# Patient Record
Sex: Female | Born: 1935
Health system: Southern US, Community
[De-identification: ages and names within clinical notes are randomized; demographics above are authoritative.]

## PROBLEM LIST (undated history)

## (undated) DIAGNOSIS — I4891 Unspecified atrial fibrillation: Secondary | ICD-10-CM

## (undated) DIAGNOSIS — N3281 Overactive bladder: Secondary | ICD-10-CM

## (undated) DIAGNOSIS — E7800 Pure hypercholesterolemia, unspecified: Secondary | ICD-10-CM

## (undated) DIAGNOSIS — I10 Essential (primary) hypertension: Secondary | ICD-10-CM

## (undated) HISTORY — DX: Pure hypercholesterolemia, unspecified: E78.00

## (undated) HISTORY — DX: Unspecified atrial fibrillation: I48.91

## (undated) HISTORY — DX: Essential (primary) hypertension: I10

## (undated) HISTORY — DX: Overactive bladder: N32.81

---

## 2020-11-04 DIAGNOSIS — Z0000 Encounter for general adult medical examination without abnormal findings: Secondary | ICD-10-CM | POA: Diagnosis not present

## 2020-11-04 DIAGNOSIS — N3281 Overactive bladder: Secondary | ICD-10-CM | POA: Diagnosis not present

## 2020-12-07 ENCOUNTER — Encounter: Payer: Self-pay | Admitting: Cardiology

## 2020-12-07 ENCOUNTER — Other Ambulatory Visit: Payer: Self-pay

## 2020-12-07 ENCOUNTER — Ambulatory Visit (INDEPENDENT_AMBULATORY_CARE_PROVIDER_SITE_OTHER): Payer: Medicare Other | Admitting: Cardiology

## 2020-12-07 VITALS — BP 112/66 | HR 121 | Ht 63.0 in | Wt <= 1120 oz

## 2020-12-07 DIAGNOSIS — I10 Essential (primary) hypertension: Secondary | ICD-10-CM

## 2020-12-07 DIAGNOSIS — I4819 Other persistent atrial fibrillation: Secondary | ICD-10-CM | POA: Diagnosis not present

## 2020-12-07 DIAGNOSIS — Z01812 Encounter for preprocedural laboratory examination: Secondary | ICD-10-CM

## 2020-12-07 DIAGNOSIS — I480 Paroxysmal atrial fibrillation: Secondary | ICD-10-CM | POA: Diagnosis not present

## 2020-12-07 LAB — CBC
Hematocrit: 38.2 % (ref 34.0–46.6)
Hemoglobin: 11.9 g/dL (ref 11.1–15.9)
MCH: 26.4 pg — ABNORMAL LOW (ref 26.6–33.0)
MCHC: 31.2 g/dL — ABNORMAL LOW (ref 31.5–35.7)
MCV: 85 fL (ref 79–97)
Platelets: 193 10*3/uL (ref 150–450)
RBC: 4.51 x10E6/uL (ref 3.77–5.28)
RDW: 15.3 % (ref 11.7–15.4)
WBC: 9 10*3/uL (ref 3.4–10.8)

## 2020-12-07 LAB — BASIC METABOLIC PANEL
BUN/Creatinine Ratio: 34 — ABNORMAL HIGH (ref 12–28)
BUN: 32 mg/dL — ABNORMAL HIGH (ref 8–27)
CO2: 21 mmol/L (ref 20–29)
Calcium: 9 mg/dL (ref 8.7–10.3)
Chloride: 102 mmol/L (ref 96–106)
Creatinine, Ser: 0.93 mg/dL (ref 0.57–1.00)
Glucose: 115 mg/dL — ABNORMAL HIGH (ref 65–99)
Potassium: 4.4 mmol/L (ref 3.5–5.2)
Sodium: 140 mmol/L (ref 134–144)
eGFR: 59 mL/min/{1.73_m2} — ABNORMAL LOW (ref >59)

## 2020-12-07 MED ORDER — FLECAINIDE ACETATE 50 MG PO TABS: 75.0000 mg | ORAL_TABLET | Freq: Two times a day (BID) | ORAL | 1 refills | Status: DC

## 2020-12-07 NOTE — Progress Notes (Signed)
Electrophysiology Office Note:    Date:  12/07/2020   ID:  Kristin Hancock, DOB 08/01/1931, MRN [id number]  PCP:  Mitchell, L.Dean, MD  [facility] Cardiologist:  No primary care provider on file.  [facility] Electrophysiologist:  CAMERON T LAMBERT, MD   Referring MD: No ref. provider found   Chief Complaint: Atrial fibrillation  History of Present Illness:    Kristin Hancock is a 87 y.o. female who presents for an evaluation of atrial fibrillation at the request of Dr. Baugh. Their medical history includes atrial fibrillation, hypertension, hypercholesterolemia.  She last saw Dr. Baugh in [place] on Feb 20, 2020.  She is recently relocated to [place] to be closer to family.  She has been followed in [place] for her atrial fibrillation.  She is maintained on Eliquis 5 mg twice daily and flecainide 50 mg twice daily and atenolol 100 mg daily.  She has very minimal symptoms when she is out of rhythm.  Actually she does not know that she is out of rhythm during today's appointment until I showed her the EKG.  Past Medical History:  Diagnosis Date  . Atrial fibrillation (HCC)   . Hypercholesterolemia   . Hypertension   . Overactive bladder       Current Medications: Current Meds  Medication Sig  . amLODipine (NORVASC) 5 MG tablet Take 5 mg by mouth daily.  . apixaban (ELIQUIS) 2.5 MG TABS tablet Take 2.5 mg by mouth 2 (two) times daily.  . atenolol (TENORMIN) 100 MG tablet Take 100 mg by mouth daily.  . flecainide (TAMBOCOR) 50 MG tablet Take 1.5 tablets (75 mg total) by mouth 2 (two) times daily.  . hydrochlorothiazide (HYDRODIURIL) 12.5 MG tablet Take 12.5 mg by mouth daily.  . losartan (COZAAR) 100 MG tablet Take 100 mg by mouth daily.  . Multiple Vitamins-Minerals (PRESERVISION/LUTEIN) CAPS Take 1 capsule by mouth in the morning and at bedtime.  . simvastatin (ZOCOR) 20 MG tablet Take 20 mg by mouth daily.  . [DISCONTINUED] flecainide  (TAMBOCOR) 50 MG tablet Take 50 mg by mouth 2 (two) times daily.     Allergies:   Patient has no known allergies.   Social History   Socioeconomic History  . Marital status: Married    Spouse name: Not on file  . Number of children: Not on file  . Years of education: Not on file  . Highest education level: Not on file  Occupational History  . Not on file  Tobacco Use  . Smoking status: Never Smoker  . Smokeless tobacco: Never Used  Substance and Sexual Activity  . Alcohol use: Not on file  . Drug use: Not on file  . Sexual activity: Not on file  Other Topics Concern  . Not on file  Social History Narrative  . Not on file   Social Determinants of Health   Financial Resource Strain: Not on file  Food Insecurity: Not on file  Transportation Needs: Not on file  Physical Activity: Not on file  Stress: Not on file  Social Connections: Not on file     Family History: The patient's family history includes CAD in her brother; COPD in her father; Diabetes in her brother and maternal grandmother; Heart failure in her mother.  ROS:   Please see the history of present illness.    All other systems reviewed and are negative.  EKGs/Labs/Other Studies Reviewed:    The following studies were reviewed today:  Prior notes from     EKG:  The ekg ordered today demonstrates atrial fibrillation with a ventricular rate of 121 bpm.  Recent Labs: No results found for requested labs within last 8760 hours.  Recent Lipid Panel No results found for: CHOL, TRIG, HDL, CHOLHDL, VLDL, LDLCALC, LDLDIRECT  Physical Exam:    VS:  BP 112/66   Pulse (!) 121   Ht 5\' 3"  (1.6 m)   Wt 12 lb (5.443 kg)   SpO2 99%   BMI 2.13 kg/m     Wt Readings from Last 3 Encounters:  12/07/20 12 lb (5.443 kg)     GEN:  Well nourished, well developed in no acute distress HEENT: Normal NECK: No JVD; No carotid bruits LYMPHATICS: No lymphadenopathy CARDIAC: Irregularly irregular,  tachycardic, no murmurs, rubs, gallops RESPIRATORY:  Clear to auscultation without rales, wheezing or rhonchi  ABDOMEN: Soft, non-tender, non-distended MUSCULOSKELETAL:  No edema; No deformity  SKIN: Warm and dry NEUROLOGIC:  Alert and oriented x 3 PSYCHIATRIC:  Normal affect   ASSESSMENT:    1. Persistent atrial fibrillation (HCC)   2. Pre-procedure lab exam   3. Primary hypertension    PLAN:    In order of problems listed above:  1. Persistent atrial fibrillation Patient is in poorly rate controlled atrial fibrillation despite treatment with flecainide, atenolol.  We will plan to increase her flecainide to 75 mg by mouth twice daily.  She will continue taking the current dose of atenolol.  We will arrange for a synchronized cardioversion to restore normal rhythm.  2.  Hypertension Controlled.  Continue losartan, hydrochlorothiazide and amlodipine   Medication Adjustments/Labs and Tests Ordered: Current medicines are reviewed at length with the patient today.  Concerns regarding medicines are outlined above.  Orders Placed This Encounter  Procedures  . Basic metabolic panel  . CBC  . EKG 12-Lead   Meds ordered this encounter  Medications  . flecainide (TAMBOCOR) 50 MG tablet    Sig: Take 1.5 tablets (75 mg total) by mouth 2 (two) times daily.    Dispense:  270 tablet    Refill:  1     Signed, 12/09/20, MD, [facility]  12/07/2020 1:51 PM    Electrophysiology [facility]

## 2020-12-07 NOTE — Patient Instructions (Addendum)
Medication Instructions:  Your physician has recommended you make the following change in your medication:  1. INCREASE Flecainide to 75 mg twice daily  *If you need a refill on your cardiac medications before your next appointment, please call your pharmacy*   Lab Work: Pre procedure labs today: BMET & CBC If you have labs (blood work) drawn today and your tests are completely normal, you will receive your results only by: Marland Kitchen MyChart Message (if you have MyChart) OR . A paper copy in the mail If you have any lab test that is abnormal or we need to change your treatment, we will call you to review the results.   Testing/Procedures: Your physician has recommended that you have a Cardioversion (DCCV). Electrical Cardioversion uses a jolt of electricity to your heart either through paddles or wired patches attached to your chest. This is a controlled, usually prescheduled, procedure. Defibrillation is done under light anesthesia in the hospital, and you usually go home the day of the procedure. This is done to get your heart back into a normal rhythm. You are not awake for the procedure. Please see the instructions below located under "other instructions".     Follow-Up: At [facility], you and your health needs are our priority.  As part of our continuing mission to provide you with exceptional heart care, we have created designated Provider Care Teams.  These Care Teams include your primary Cardiologist (physician) and Advanced Practice Providers (APPs -  Physician Assistants and Nurse Practitioners) who all work together to provide you with the care you need, when you need it.  We recommend signing up for the patient portal called "MyChart".  Sign up information is provided on this After Visit Summary.  MyChart is used to connect with patients for Virtual Visits (Telemedicine).  Patients are able to view lab/test results, encounter notes, upcoming appointments, etc.  Non-urgent messages can  be sent to your provider as well.   To learn more about what you can do with MyChart, go to [email or web address].    Your next appointment:   3 month(s)  The format for your next appointment:   In Person  Provider:   Steffanie Dunn, MD    Thank you for choosing [facility]!!   [phone]   Other Instructions  COVID TEST-- On 12/15/20 @ 11:00 am - You will go to [address] [place], [place] for your Covid testing.   This is a drive thru test site, stay in your car and the nurse team will come to your car to test you.  After you are tested please go home and self quarantine until the day of your procedure.     You are scheduled for a Cardioversion on 12/17/2020 with Dr. Mayford Knife.  Please arrive at the [facility] (Main Entrance A) at [facility]: [address] [place], [place] [address] at 9:00 am.   DIET: Nothing to eat or drink after midnight except a sip of water with medications (see medication instructions below)  Medication Instructions: Hold all medications the morning of this procedure  Continue your anticoagulant: Eliquis You will need to continue your anticoagulant after your procedure until you are told by your provider that it is safe to stop   Labs: Today 3/15  You must have a responsible person to drive you home and stay in the waiting area during your procedure. Failure to do so could result in cancellation.  Bring your insurance cards.  *Special Note: Every effort is  made to have your procedure done on time. Occasionally there are emergencies that occur at the hospital that may cause delays. Please be patient if a delay does occur.      Electrical Cardioversion Electrical cardioversion is the delivery of a jolt of electricity to restore a normal rhythm to the heart. A rhythm that is too fast or is not regular keeps the heart from pumping well. In this procedure, sticky patches or metal paddles are placed on the chest to deliver  electricity to the heart from a device. This procedure may be done in an emergency if:  There is low or no blood pressure as a result of the heart rhythm.  Normal rhythm must be restored as fast as possible to protect the brain and heart from further damage.  It may save a life. This may also be a scheduled procedure for irregular or fast heart rhythms that are not immediately life-threatening. Tell a health care provider about:  Any allergies you have.  All medicines you are taking, including vitamins, herbs, eye drops, creams, and over-the-counter medicines.  Any problems you or family members have had with anesthetic medicines.  Any blood disorders you have.  Any surgeries you have had.  Any medical conditions you have.  Whether you are pregnant or may be pregnant. What are the risks? Generally, this is a safe procedure. However, problems may occur, including:  Allergic reactions to medicines.  A blood clot that breaks free and travels to other parts of your body.  The possible return of an abnormal heart rhythm within hours or days after the procedure.  Your heart stopping (cardiac arrest). This is rare. What happens before the procedure? Medicines  Your health care provider may have you start taking: ? Blood-thinning medicines (anticoagulants) so your blood does not clot as easily. ? Medicines to help stabilize your heart rate and rhythm.  Ask your health care provider about: ? Changing or stopping your regular medicines. This is especially important if you are taking diabetes medicines or blood thinners. ? Taking medicines such as aspirin and ibuprofen. These medicines can thin your blood. Do not take these medicines unless your health care provider tells you to take them. ? Taking over-the-counter medicines, vitamins, herbs, and supplements. General instructions  Follow instructions from your health care provider about eating or drinking restrictions.  Plan to  have someone take you home from the hospital or clinic.  If you will be going home right after the procedure, plan to have someone with you for 24 hours.  Ask your health care provider what steps will be taken to help prevent infection. These may include washing your skin with a germ-killing soap. What happens during the procedure?  An IV will be inserted into one of your veins.  Sticky patches (electrodes) or metal paddles may be placed on your chest.  You will be given a medicine to help you relax (sedative).  An electrical shock will be delivered. The procedure may vary among health care providers and hospitals.   What can I expect after the procedure?  Your blood pressure, heart rate, breathing rate, and blood oxygen level will be monitored until you leave the hospital or clinic.  Your heart rhythm will be watched to make sure it does not change.  You may have some redness on the skin where the shocks were given. Follow these instructions at home:  Do not drive for 24 hours if you were given a sedative during your procedure.  Take over-the-counter and prescription medicines only as told by your health care provider.  Ask your health care provider how to check your pulse. Check it often.  Rest for 48 hours after the procedure or as told by your health care provider.  Avoid or limit your caffeine use as told by your health care provider.  Keep all follow-up visits as told by your health care provider. This is important. Contact a health care provider if:  You feel like your heart is beating too quickly or your pulse is not regular.  You have a serious muscle cramp that does not go away. Get help right away if:  You have discomfort in your chest.  You are dizzy or you feel faint.  You have trouble breathing or you are short of breath.  Your speech is slurred.  You have trouble moving an arm or leg on one side of your body.  Your fingers or toes turn cold or  blue. Summary  Electrical cardioversion is the delivery of a jolt of electricity to restore a normal rhythm to the heart.  This procedure may be done right away in an emergency or may be a scheduled procedure if the condition is not an emergency.  Generally, this is a safe procedure.  After the procedure, check your pulse often as told by your health care provider. This information is not intended to replace advice given to you by your health care provider. Make sure you discuss any questions you have with your health care provider. Document Revised: 04/14/2019 Document Reviewed: 04/14/2019 Elsevier Patient Education  2021 Elsevier In

## 2020-12-07 NOTE — H&P (View-Only) (Signed)
Electrophysiology Office Note:    Date:  12/07/2020   ID:  Kristin Hancock, DOB July 11, 1931, MRN [id number]  PCP:  Asencion Gowda.August Saucer, MD  [facility] Cardiologist:  No primary care provider on file.  [facility] Electrophysiologist:  Lanier Prude, MD   Referring MD: No ref. provider found   Chief Complaint: Atrial fibrillation  History of Present Illness:    Kristin Hancock is a 87 y.o. female who presents for an evaluation of atrial fibrillation at the request of Dr. Merceda Elks. Their medical history includes atrial fibrillation, hypertension, hypercholesterolemia.  She last saw Dr. Merceda Elks in [place] on Feb 20, 2020.  She is recently relocated to [facility] to be closer to family.  She has been followed in [place] for her atrial fibrillation.  She is maintained on Eliquis 5 mg twice daily and flecainide 50 mg twice daily and atenolol 100 mg daily.  She has very minimal symptoms when she is out of rhythm.  Actually she does not know that she is out of rhythm during today's appointment until I showed her the EKG.  Past Medical History:  Diagnosis Date  . Atrial fibrillation (HCC)   . Hypercholesterolemia   . Hypertension   . Overactive bladder       Current Medications: Current Meds  Medication Sig  . amLODipine (NORVASC) 5 MG tablet Take 5 mg by mouth daily.  Marland Kitchen apixaban (ELIQUIS) 2.5 MG TABS tablet Take 2.5 mg by mouth 2 (two) times daily.  Marland Kitchen atenolol (TENORMIN) 100 MG tablet Take 100 mg by mouth daily.  . flecainide (TAMBOCOR) 50 MG tablet Take 1.5 tablets (75 mg total) by mouth 2 (two) times daily.  . hydrochlorothiazide (HYDRODIURIL) 12.5 MG tablet Take 12.5 mg by mouth daily.  Marland Kitchen losartan (COZAAR) 100 MG tablet Take 100 mg by mouth daily.  . Multiple Vitamins-Minerals (PRESERVISION/LUTEIN) CAPS Take 1 capsule by mouth in the Hancock and at bedtime.  . simvastatin (ZOCOR) 20 MG tablet Take 20 mg by mouth daily.  . [DISCONTINUED] flecainide  (TAMBOCOR) 50 MG tablet Take 50 mg by mouth 2 (two) times daily.     Allergies:   Patient has no known allergies.   Social History   Socioeconomic History  . Marital status: Married    Spouse name: Not on file  . Number of children: Not on file  . Years of education: Not on file  . Highest education level: Not on file  Occupational History  . Not on file  Tobacco Use  . Smoking status: Never Smoker  . Smokeless tobacco: Never Used  Substance and Sexual Activity  . Alcohol use: Not on file  . Drug use: Not on file  . Sexual activity: Not on file  Other Topics Concern  . Not on file  Social History Narrative  . Not on file   Social Determinants of Health   Financial Resource Strain: Not on file  Food Insecurity: Not on file  Transportation Needs: Not on file  Physical Activity: Not on file  Stress: Not on file  Social Connections: Not on file     Family History: The patient's family history includes CAD in her brother; COPD in her father; Diabetes in her brother and maternal grandmother; Heart failure in her mother.  ROS:   Please see the history of present illness.    All other systems reviewed and are negative.  EKGs/Labs/Other Studies Reviewed:    The following studies were reviewed today:  Prior notes from [place]  EKG:  The ekg ordered today demonstrates atrial fibrillation with a ventricular rate of 121 bpm.  Recent Labs: No results found for requested labs within last 8760 hours.  Recent Lipid Panel No results found for: CHOL, TRIG, HDL, CHOLHDL, VLDL, LDLCALC, LDLDIRECT  Physical Exam:    VS:  BP 112/66   Pulse (!) 121   Ht 5\' 3"  (1.6 m)   Wt 12 lb (5.443 kg)   SpO2 99%   BMI 2.13 kg/m     Wt Readings from Last 3 Encounters:  12/07/20 12 lb (5.443 kg)     GEN:  Well nourished, well developed in no acute distress HEENT: Normal NECK: No JVD; No carotid bruits LYMPHATICS: No lymphadenopathy CARDIAC: Irregularly irregular,  tachycardic, no murmurs, rubs, gallops RESPIRATORY:  Clear to auscultation without rales, wheezing or rhonchi  ABDOMEN: Soft, non-tender, non-distended MUSCULOSKELETAL:  No edema; No deformity  SKIN: Warm and dry NEUROLOGIC:  Alert and oriented x 3 PSYCHIATRIC:  Normal affect   ASSESSMENT:    1. Persistent atrial fibrillation (HCC)   2. Pre-procedure lab exam   3. Primary hypertension    PLAN:    In order of problems listed above:  1. Persistent atrial fibrillation Patient is in poorly rate controlled atrial fibrillation despite treatment with flecainide, atenolol.  We will plan to increase her flecainide to 75 mg by mouth twice daily.  She will continue taking the current dose of atenolol.  We will arrange for a synchronized cardioversion to restore normal rhythm.  2.  Hypertension Controlled.  Continue losartan, hydrochlorothiazide and amlodipine   Medication Adjustments/Labs and Tests Ordered: Current medicines are reviewed at length with the patient today.  Concerns regarding medicines are outlined above.  Orders Placed This Encounter  Procedures  . Basic metabolic panel  . CBC  . EKG 12-Lead   Meds ordered this encounter  Medications  . flecainide (TAMBOCOR) 50 MG tablet    Sig: Take 1.5 tablets (75 mg total) by mouth 2 (two) times daily.    Dispense:  270 tablet    Refill:  1     Signed, 12/09/20, MD, [facility]  12/07/2020 1:51 PM    Electrophysiology [facility]

## 2020-12-14 DIAGNOSIS — B372 Candidiasis of skin and nail: Secondary | ICD-10-CM | POA: Diagnosis not present

## 2020-12-15 ENCOUNTER — Other Ambulatory Visit (HOSPITAL_COMMUNITY)
Admission: RE | Admit: 2020-12-15 | Discharge: 2020-12-15 | Disposition: A | Discharge status: Home / Self Care | Payer: Medicare Other | Source: Ambulatory Visit | Attending: Cardiology | Admitting: Cardiology

## 2020-12-15 DIAGNOSIS — Z01812 Encounter for preprocedural laboratory examination: Secondary | ICD-10-CM | POA: Diagnosis not present

## 2020-12-15 DIAGNOSIS — Z20822 Contact with and (suspected) exposure to covid-19: Secondary | ICD-10-CM | POA: Insufficient documentation

## 2020-12-15 LAB — SARS CORONAVIRUS 2 (TAT 6-24 HRS): SARS Coronavirus 2: NEGATIVE

## 2020-12-17 ENCOUNTER — Other Ambulatory Visit: Payer: Self-pay

## 2020-12-17 ENCOUNTER — Ambulatory Visit (HOSPITAL_COMMUNITY)
Admission: RE | Admit: 2020-12-17 | Discharge: 2020-12-17 | Disposition: A | Discharge status: Home / Self Care | Payer: Medicare Other | Attending: Cardiology | Admitting: Cardiology

## 2020-12-17 ENCOUNTER — Encounter (HOSPITAL_COMMUNITY)
Admission: RE | Disposition: A | Discharge status: Home / Self Care | Payer: Self-pay | Source: Home / Self Care | Attending: Cardiology

## 2020-12-17 ENCOUNTER — Encounter (HOSPITAL_COMMUNITY): Payer: Self-pay | Admitting: Cardiology

## 2020-12-17 ENCOUNTER — Ambulatory Visit (HOSPITAL_COMMUNITY): Payer: Medicare Other | Admitting: Certified Registered Nurse Anesthetist

## 2020-12-17 DIAGNOSIS — Z7901 Long term (current) use of anticoagulants: Secondary | ICD-10-CM | POA: Diagnosis not present

## 2020-12-17 DIAGNOSIS — Z79899 Other long term (current) drug therapy: Secondary | ICD-10-CM | POA: Insufficient documentation

## 2020-12-17 DIAGNOSIS — E7800 Pure hypercholesterolemia, unspecified: Secondary | ICD-10-CM | POA: Insufficient documentation

## 2020-12-17 DIAGNOSIS — I10 Essential (primary) hypertension: Secondary | ICD-10-CM | POA: Diagnosis not present

## 2020-12-17 DIAGNOSIS — Z8249 Family history of ischemic heart disease and other diseases of the circulatory system: Secondary | ICD-10-CM | POA: Insufficient documentation

## 2020-12-17 DIAGNOSIS — I4819 Other persistent atrial fibrillation: Secondary | ICD-10-CM

## 2020-12-17 HISTORY — PX: CARDIOVERSION: SHX1299

## 2020-12-17 SURGERY — CARDIOVERSION
Anesthesia: General

## 2020-12-17 MED ORDER — SODIUM CHLORIDE 0.9 % IV SOLN: INTRAVENOUS | Status: DC | PRN

## 2020-12-17 MED ORDER — LIDOCAINE 2% (20 MG/ML) 5 ML SYRINGE
INTRAMUSCULAR | Status: DC | PRN
  Administered 2020-12-17: 60 mg via INTRAVENOUS

## 2020-12-17 MED ORDER — PROPOFOL 10 MG/ML IV BOLUS
INTRAVENOUS | Status: DC | PRN
  Administered 2020-12-17: 50 mg via INTRAVENOUS

## 2020-12-17 MED ORDER — APIXABAN 2.5 MG PO TABS
2.5000 mg | ORAL_TABLET | Freq: Once | ORAL | Status: AC
  Administered 2020-12-17: 2.5 mg via ORAL
  Filled 2020-12-17: qty 1

## 2020-12-17 MED ORDER — EPHEDRINE SULFATE-NACL 50-0.9 MG/10ML-% IV SOSY
PREFILLED_SYRINGE | INTRAVENOUS | Status: DC | PRN
  Administered 2020-12-17: 10 mg via INTRAVENOUS

## 2020-12-17 MED ORDER — APIXABAN 5 MG PO TABS: 5.0000 mg | ORAL_TABLET | Freq: Two times a day (BID) | ORAL | 3 refills | Status: DC

## 2020-12-17 NOTE — CV Procedure (Addendum)
   Electrical Cardioversion Procedure Note Kristin Hancock [id number] 1930-12-08  Procedure: Electrical Cardioversion Indications:  Atrial Fibrillation  Time Out: Verified patient identification, verified procedure,medications/allergies/relevent history reviewed, required imaging and test results available.  Performed  Procedure Details  The patient was NPO after midnight. Anesthesia was administered at the beside  by Dr.Hodierne with 50mg  of propofol and 60mg  Lidocaine.  Cardioversion was done with synchronized biphasic defibrillation with AP pads with 150watts.  The patient converted to normal sinus rhythm. The patient tolerated the procedure well   IMPRESSION:  Successful cardioversion of atrial fibrillation  Discussed with Dr. >>patient had been on Eliquis 5mg  BID but decreased to 2.5mg  BID due to GI bleed last April.  Due to DCCV will increase Eliquis to 5mg  BID give that her weight is > 60kg.  Followup with Dr. Lalla Brothers in 4 weeks at which time he will decrease dose back to 2.5mg  BID.    Kristin Hancock 12/17/2020, 8:59 AM

## 2020-12-17 NOTE — Anesthesia Postprocedure Evaluation (Signed)
Anesthesia Post Note  Patient: Janie Morning  Procedure(s) Performed: CARDIOVERSION (N/A )     Patient location during evaluation: Endoscopy Anesthesia Type: General Level of consciousness: awake and alert Pain management: pain level controlled Vital Signs Assessment: post-procedure vital signs reviewed and stable Respiratory status: spontaneous breathing, nonlabored ventilation, respiratory function stable and patient connected to nasal cannula oxygen Cardiovascular status: blood pressure returned to baseline and stable Postop Assessment: no apparent nausea or vomiting Anesthetic complications: no   No complications documented.  Last Vitals:  Vitals:   12/17/20 1007 12/17/20 1017  BP: (!) 107/51 (!) 109/52  Pulse: 64 64  Resp: 15 11  Temp:    SpO2: 97% 98%    Last Pain:  Vitals:   12/17/20 0947  TempSrc:   PainSc: 0-No pain                 Prisca Gearing S

## 2020-12-17 NOTE — Anesthesia Preprocedure Evaluation (Signed)
Anesthesia Evaluation  Patient identified by MRN, date of birth, ID band Patient awake    Reviewed: Allergy & Precautions, H&P , NPO status , Patient's Chart, lab work & pertinent test results  Airway Mallampati: II   Neck ROM: full    Dental   Pulmonary neg pulmonary ROS,    breath sounds clear to auscultation       Cardiovascular hypertension, + dysrhythmias Atrial Fibrillation  Rhythm:irregular Rate:Normal     Neuro/Psych    GI/Hepatic   Endo/Other    Renal/GU      Musculoskeletal   Abdominal   Peds  Hematology   Anesthesia Other Findings   Reproductive/Obstetrics                             Anesthesia Physical Anesthesia Plan  ASA: III  Anesthesia Plan: General   Post-op Pain Management:    Induction: Intravenous  PONV Risk Score and Plan: 3 and Propofol infusion and Treatment may vary due to age or medical condition  Airway Management Planned: Mask  Additional Equipment:   Intra-op Plan:   Post-operative Plan:   Informed Consent: I have reviewed the patients History and Physical, chart, labs and discussed the procedure including the risks, benefits and alternatives for the proposed anesthesia with the patient or authorized representative who has indicated his/her understanding and acceptance.     Dental advisory given  Plan Discussed with: CRNA, Anesthesiologist and Surgeon  Anesthesia Plan Comments:         Anesthesia Quick Evaluation

## 2020-12-17 NOTE — Transfer of Care (Signed)
Immediate Anesthesia Transfer of Care Note  Patient: Kristin Hancock  Procedure(s) Performed: CARDIOVERSION (N/A )  Patient Location: PACU and Endoscopy Unit  Anesthesia Type:General  Level of Consciousness: drowsy and responds to stimulation  Airway & Oxygen Therapy: Patient Spontanous Breathing  Post-op Assessment: Report given to RN and Post -op Vital signs reviewed and stable  Post vital signs: Reviewed and stable  Last Vitals:  Vitals Value Taken Time  BP    Temp    Pulse    Resp    SpO2      Last Pain:  Vitals:   12/17/20 0915  TempSrc: Oral  PainSc: 0-No pain         Complications: No complications documented.

## 2020-12-17 NOTE — Interval H&P Note (Signed)
History and Physical Interval Note:  12/17/2020 8:59 AM  Kristin Hancock  has presented today for surgery, with the diagnosis of AFIB.  The various methods of treatment have been discussed with the patient and family. After consideration of risks, benefits and other options for treatment, the patient has consented to  Procedure(s): CARDIOVERSION (N/A) as a surgical intervention.  The patient's history has been reviewed, patient examined, no change in status, stable for surgery.  I have reviewed the patient's chart and labs.  Questions were answered to the patient's satisfaction.     Armanda Magic

## 2020-12-17 NOTE — Discharge Instructions (Signed)
Electrical Cardioversion Electrical cardioversion is the delivery of a jolt of electricity to restore a normal rhythm to the heart. A rhythm that is too fast or is not regular keeps the heart from pumping well. In this procedure, sticky patches or metal paddles are placed on the chest to deliver electricity to the heart from a device. This procedure may be done in an emergency if:  There is low or no blood pressure as a result of the heart rhythm.  Normal rhythm must be restored as fast as possible to protect the brain and heart from further damage.  It may save a life. This may also be a scheduled procedure for irregular or fast heart rhythms that are not immediately life-threatening. Tell a health care provider about:  Any allergies you have.  All medicines you are taking, including vitamins, herbs, eye drops, creams, and over-the-counter medicines.  Any problems you or family members have had with anesthetic medicines.  Any blood disorders you have.  Any surgeries you have had.  Any medical conditions you have.  Whether you are pregnant or may be pregnant. What are the risks? Generally, this is a safe procedure. However, problems may occur, including:  Allergic reactions to medicines.  A blood clot that breaks free and travels to other parts of your body.  The possible return of an abnormal heart rhythm within hours or days after the procedure.  Your heart stopping (cardiac arrest). This is rare. What happens before the procedure? Medicines  Your health care provider may have you start taking: ? Blood-thinning medicines (anticoagulants) so your blood does not clot as easily. ? Medicines to help stabilize your heart rate and rhythm.  Ask your health care provider about: ? Changing or stopping your regular medicines. This is especially important if you are taking diabetes medicines or blood thinners. ? Taking medicines such as aspirin and ibuprofen. These medicines can  thin your blood. Do not take these medicines unless your health care provider tells you to take them. ? Taking over-the-counter medicines, vitamins, herbs, and supplements. General instructions  Follow instructions from your health care provider about eating or drinking restrictions.  Plan to have someone take you home from the hospital or clinic.  If you will be going home right after the procedure, plan to have someone with you for 24 hours.  Ask your health care provider what steps will be taken to help prevent infection. These may include washing your skin with a germ-killing soap. What happens during the procedure?  An IV will be inserted into one of your veins.  Sticky patches (electrodes) or metal paddles may be placed on your chest.  You will be given a medicine to help you relax (sedative).  An electrical shock will be delivered. The procedure may vary among health care providers and hospitals.   What can I expect after the procedure?  Your blood pressure, heart rate, breathing rate, and blood oxygen level will be monitored until you leave the hospital or clinic.  Your heart rhythm will be watched to make sure it does not change.  You may have some redness on the skin where the shocks were given. Follow these instructions at home:  Do not drive for 24 hours if you were given a sedative during your procedure.  Take over-the-counter and prescription medicines only as told by your health care provider.  Ask your health care provider how to check your pulse. Check it often.  Rest for 48 hours after the procedure   or as told by your health care provider.  Avoid or limit your caffeine use as told by your health care provider.  Keep all follow-up visits as told by your health care provider. This is important. Contact a health care provider if:  You feel like your heart is beating too quickly or your pulse is not regular.  You have a serious muscle cramp that does not go  away. Get help right away if:  You have discomfort in your chest.  You are dizzy or you feel faint.  You have trouble breathing or you are short of breath.  Your speech is slurred.  You have trouble moving an arm or leg on one side of your body.  Your fingers or toes turn cold or blue. Summary  Electrical cardioversion is the delivery of a jolt of electricity to restore a normal rhythm to the heart.  This procedure may be done right away in an emergency or may be a scheduled procedure if the condition is not an emergency.  Generally, this is a safe procedure.  After the procedure, check your pulse often as told by your health care provider. This information is not intended to replace advice given to you by your health care provider. Make sure you discuss any questions you have with your health care provider. Document Revised: 04/14/2019 Document Reviewed: 04/14/2019 Elsevier Patient Education  2021 Elsevier Inc.  

## 2020-12-17 NOTE — Anesthesia Procedure Notes (Signed)
Procedure Name: General with mask airway Date/Time: 12/17/2020 9:31 AM Performed by: Drema Pry, CRNA Pre-anesthesia Checklist: Patient identified, Emergency Drugs available, Suction available, Patient being monitored and Timeout performed Patient Re-evaluated:Patient Re-evaluated prior to induction Oxygen Delivery Method: Ambu bag Preoxygenation: Pre-oxygenation with 100% oxygen Induction Type: IV induction Ventilation: Mask ventilation without difficulty

## 2020-12-18 ENCOUNTER — Encounter (HOSPITAL_COMMUNITY): Payer: Self-pay | Admitting: Cardiology

## 2020-12-21 DIAGNOSIS — N3281 Overactive bladder: Secondary | ICD-10-CM | POA: Diagnosis not present

## 2020-12-28 DIAGNOSIS — H353213 Exudative age-related macular degeneration, right eye, with inactive scar: Secondary | ICD-10-CM | POA: Diagnosis not present

## 2020-12-28 DIAGNOSIS — H353121 Nonexudative age-related macular degeneration, left eye, early dry stage: Secondary | ICD-10-CM | POA: Diagnosis not present

## 2020-12-30 ENCOUNTER — Encounter: Payer: Self-pay | Admitting: Cardiology

## 2020-12-30 ENCOUNTER — Ambulatory Visit: Payer: Medicare Other | Admitting: Cardiology

## 2020-12-30 ENCOUNTER — Other Ambulatory Visit: Payer: Self-pay

## 2020-12-30 VITALS — BP 132/74 | HR 64 | Ht 63.0 in | Wt 170.6 lb

## 2020-12-30 DIAGNOSIS — I10 Essential (primary) hypertension: Secondary | ICD-10-CM

## 2020-12-30 DIAGNOSIS — I4819 Other persistent atrial fibrillation: Secondary | ICD-10-CM | POA: Diagnosis not present

## 2020-12-30 DIAGNOSIS — Z79899 Other long term (current) drug therapy: Secondary | ICD-10-CM

## 2020-12-30 MED ORDER — APIXABAN 5 MG PO TABS: 5.0000 mg | ORAL_TABLET | Freq: Two times a day (BID) | ORAL | 3 refills | Status: DC

## 2020-12-30 NOTE — Progress Notes (Signed)
Electrophysiology Office Follow up Visit Note:    Date:  12/30/2020   ID:  Kristin Hancock, DOB 12/02/1930, MRN [id number]  PCP:  Clovis Riley, L.August Saucer, MD  [facility] Cardiologist:  No primary care provider on file.  [facility] Electrophysiologist:  Lanier Prude, MD    Interval History:    Kristin Hancock is a 87 y.o. female who presents for a follow up visit for her persistent atrial fibrillation after cardioversion on December 17, 2020.  I last saw the patient December 07, 2020.  Prior to the cardioversion, the patient was on Eliquis 2.5 mg twice daily because of a GI bleed last April.  After the cardioversion we elected to increase the Eliquis back to 5 mg twice daily.  She presents today to discuss her anticoagulation dosing and whether not to stay at the current dose versus decrease.  She tells me she is feeling better after her cardioversion with increased energy.  She does not fatigue like she did when she was in atrial fibrillation.  No trouble while taking the Eliquis 5 mg by mouth twice daily.   Past Medical History:  Diagnosis Date  . Atrial fibrillation (HCC)   . Hypercholesterolemia   . Hypertension   . Overactive bladder     Past Surgical History:  Procedure Laterality Date  . CARDIOVERSION N/A 12/17/2020   Procedure: CARDIOVERSION;  Surgeon: Quintella Reichert, MD;  Location: [facility] ENDOSCOPY;  Service: Cardiovascular;  Laterality: N/A;    Current Medications: Current Meds  Medication Sig  . amLODipine (NORVASC) 5 MG tablet Take 5 mg by mouth daily.  Marland Kitchen atenolol (TENORMIN) 100 MG tablet Take 100 mg by mouth daily.  . flecainide (TAMBOCOR) 50 MG tablet Take 1.5 tablets (75 mg total) by mouth 2 (two) times daily.  . hydrochlorothiazide (HYDRODIURIL) 12.5 MG tablet Take 12.5 mg by mouth daily.  Marland Kitchen losartan (COZAAR) 100 MG tablet Take 100 mg by mouth daily.  . Multiple Vitamins-Minerals (PRESERVISION/LUTEIN) CAPS Take 1 capsule by mouth in the Hancock and at bedtime.   . simvastatin (ZOCOR) 20 MG tablet Take 20 mg by mouth daily.  . solifenacin (VESICARE) 5 MG tablet Take 5 mg by mouth daily.  . [DISCONTINUED] apixaban (ELIQUIS) 5 MG TABS tablet Take 1 tablet (5 mg total) by mouth 2 (two) times daily.     Allergies:   Patient has no known allergies.   Social History   Socioeconomic History  . Marital status: Married    Spouse name: Not on file  . Number of children: Not on file  . Years of education: Not on file  . Highest education level: Not on file  Occupational History  . Not on file  Tobacco Use  . Smoking status: Never Smoker  . Smokeless tobacco: Never Used  Substance and Sexual Activity  . Alcohol use: Not on file  . Drug use: Not on file  . Sexual activity: Not on file  Other Topics Concern  . Not on file  Social History Narrative  . Not on file   Social Determinants of Health   Financial Resource Strain: Not on file  Food Insecurity: Not on file  Transportation Needs: Not on file  Physical Activity: Not on file  Stress: Not on file  Social Connections: Not on file     Family History: The patient's family history includes CAD in her brother; COPD in her father; Diabetes in her brother and maternal grandmother; Heart failure in her mother.  ROS:   Please see  the history of present illness.    All other systems reviewed and are negative.  EKGs/Labs/Other Studies Reviewed:    The following studies were reviewed today:  Cardioversion records  EKG:  The ekg ordered today demonstrates sinus rhythm.  QRS duration 100 ms.  PR interval 182 ms.  Recent Labs: 12/07/2020: BUN 32; Creatinine, Ser 0.93; Hemoglobin 11.9; Platelets 193; Potassium 4.4; Sodium 140  Recent Lipid Panel No results found for: CHOL, TRIG, HDL, CHOLHDL, VLDL, LDLCALC, LDLDIRECT  Physical Exam:    VS:  BP 132/74   Pulse 64   Ht 5\' 3"  (1.6 m)   Wt 170 lb 9.6 oz (77.4 kg)   SpO2 96%   BMI 30.22 kg/m     Wt Readings from Last 3 Encounters:   12/30/20 170 lb 9.6 oz (77.4 kg)  12/17/20 169 lb 12.1 oz (77 kg)  12/07/20 12 lb (5.443 kg)     GEN:  Well nourished, well developed in no acute distress HEENT: Normal NECK: No JVD; No carotid bruits LYMPHATICS: No lymphadenopathy CARDIAC: RRR, no murmurs, rubs, gallops RESPIRATORY:  Clear to auscultation without rales, wheezing or rhonchi  ABDOMEN: Soft, non-tender, non-distended MUSCULOSKELETAL:  No edema; No deformity  SKIN: Warm and dry NEUROLOGIC:  Alert and oriented x 3 PSYCHIATRIC:  Normal affect   ASSESSMENT:    1. Persistent atrial fibrillation (HCC)   2. Primary hypertension   3. Encounter for long-term (current) use of high-risk medication    PLAN:    In order of problems listed above:  1. Persistent atrial fibrillation Doing well after cardioversion.  Maintaining sinus rhythm.  Feels better now that she is back in normal rhythm.  Given that she has tolerated the increased dose of Eliquis and has not had a bleed since the April hemorrhoidal bleed that prompted the reduced dose of Eliquis, I will plan to keep her on Eliquis 5 mg by mouth twice daily for stroke prophylaxis.  EKG is stable while taking flecainide.  I would continue this at the current dose.  Follow-up 6 months or sooner as needed.  2.  Hypertension Controlled on her current regimen hydrochlorothiazide, losartan, atenolol and amlodipine.  Follow-up 6 months    Medication Adjustments/Labs and Tests Ordered: Current medicines are reviewed at length with the patient today.  Concerns regarding medicines are outlined above.  Orders Placed This Encounter  Procedures  . EKG 12-Lead   Meds ordered this encounter  Medications  . apixaban (ELIQUIS) 5 MG TABS tablet    Sig: Take 1 tablet (5 mg total) by mouth 2 (two) times daily.    Dispense:  180 tablet    Refill:  3     Signed, May, MD, [facility], [facility] 12/30/2020 12:21 PM    Electrophysiology [facility]

## 2020-12-30 NOTE — Patient Instructions (Signed)
Medication Instructions:  Your physician recommends that you continue on your current medications as directed. Please refer to the Current Medication list given to you today. *If you need a refill on your cardiac medications before your next appointment, please call your pharmacy*  Lab Work: None ordered.  If you have labs (blood work) drawn today and your tests are completely normal, you will receive your results only by: . MyChart Message (if you have MyChart) OR . A paper copy in the mail If you have any lab test that is abnormal or we need to change your treatment, we will call you to review the results.  Testing/Procedures: None ordered.  Follow-Up: At [facility], you and your health needs are our priority.  As part of our continuing mission to provide you with exceptional heart care, we have created designated Provider Care Teams.  These Care Teams include your primary Cardiologist (physician) and Advanced Practice Providers (APPs -  Physician Assistants and Nurse Practitioners) who all work together to provide you with the care you need, when you need it.  Your next appointment:   Your physician wants you to follow-up in: 6 months with [name].  You will receive a reminder letter in the mail two months in advance. If you don't receive a letter, please call our office to schedule the follow-up appointment.   

## 2021-02-08 DIAGNOSIS — H35371 Puckering of macula, right eye: Secondary | ICD-10-CM | POA: Diagnosis not present

## 2021-02-08 DIAGNOSIS — H43813 Vitreous degeneration, bilateral: Secondary | ICD-10-CM | POA: Diagnosis not present

## 2021-02-08 DIAGNOSIS — H26492 Other secondary cataract, left eye: Secondary | ICD-10-CM | POA: Diagnosis not present

## 2021-02-08 DIAGNOSIS — H353133 Nonexudative age-related macular degeneration, bilateral, advanced atrophic without subfoveal involvement: Secondary | ICD-10-CM | POA: Diagnosis not present

## 2021-02-09 DIAGNOSIS — R8290 Unspecified abnormal findings in urine: Secondary | ICD-10-CM | POA: Diagnosis not present

## 2021-03-03 ENCOUNTER — Ambulatory Visit: Payer: Medicare Other | Admitting: Cardiology

## 2021-03-25 ENCOUNTER — Other Ambulatory Visit: Payer: Self-pay | Admitting: Family Medicine

## 2021-03-25 ENCOUNTER — Ambulatory Visit
Admission: RE | Admit: 2021-03-25 | Discharge: 2021-03-25 | Disposition: A | Discharge status: Home / Self Care | Payer: Medicare Other | Source: Ambulatory Visit | Attending: Family Medicine | Admitting: Family Medicine

## 2021-03-25 ENCOUNTER — Other Ambulatory Visit: Payer: Self-pay

## 2021-03-25 DIAGNOSIS — M25561 Pain in right knee: Secondary | ICD-10-CM | POA: Diagnosis not present

## 2021-03-25 DIAGNOSIS — S8001XA Contusion of right knee, initial encounter: Secondary | ICD-10-CM | POA: Diagnosis not present

## 2021-03-25 DIAGNOSIS — R58 Hemorrhage, not elsewhere classified: Secondary | ICD-10-CM | POA: Diagnosis not present

## 2021-04-26 ENCOUNTER — Other Ambulatory Visit: Payer: Self-pay | Admitting: Cardiology

## 2021-05-04 ENCOUNTER — Encounter: Payer: Self-pay | Admitting: Cardiology

## 2021-05-04 DIAGNOSIS — N3281 Overactive bladder: Secondary | ICD-10-CM | POA: Diagnosis not present

## 2021-05-04 DIAGNOSIS — I4891 Unspecified atrial fibrillation: Secondary | ICD-10-CM | POA: Diagnosis not present

## 2021-05-04 DIAGNOSIS — E7800 Pure hypercholesterolemia, unspecified: Secondary | ICD-10-CM | POA: Diagnosis not present

## 2021-05-04 DIAGNOSIS — I10 Essential (primary) hypertension: Secondary | ICD-10-CM | POA: Diagnosis not present

## 2021-05-06 ENCOUNTER — Telehealth: Payer: Self-pay | Admitting: Cardiology

## 2021-05-06 NOTE — Telephone Encounter (Signed)
Made in Error

## 2021-06-16 ENCOUNTER — Encounter: Payer: Self-pay | Admitting: Cardiology

## 2021-06-16 ENCOUNTER — Ambulatory Visit: Payer: Medicare Other | Admitting: Cardiology

## 2021-06-16 ENCOUNTER — Other Ambulatory Visit: Payer: Self-pay

## 2021-06-16 VITALS — BP 122/64 | HR 65 | Ht 63.0 in | Wt 160.6 lb

## 2021-06-16 DIAGNOSIS — Z79899 Other long term (current) drug therapy: Secondary | ICD-10-CM

## 2021-06-16 DIAGNOSIS — I4819 Other persistent atrial fibrillation: Secondary | ICD-10-CM | POA: Diagnosis not present

## 2021-06-16 DIAGNOSIS — I10 Essential (primary) hypertension: Secondary | ICD-10-CM

## 2021-06-16 NOTE — Patient Instructions (Addendum)
Medication Instructions:  Your physician recommends that you continue on your current medications as directed. Please refer to the Current Medication list given to you today. *If you need a refill on your cardiac medications before your next appointment, please call your pharmacy*  Lab Work: None ordered. If you have labs (blood work) drawn today and your tests are completely normal, you will receive your results only by: MyChart Message (if you have MyChart) OR A paper copy in the mail If you have any lab test that is abnormal or we need to change your treatment, we will call you to review the results.  Testing/Procedures: None ordered.  Follow-Up: At [facility], you and your health needs are our priority.  As part of our continuing mission to provide you with exceptional heart care, we have created designated Provider Care Teams.  These Care Teams include your primary Cardiologist (physician) and Advanced Practice Providers (APPs -  Physician Assistants and Nurse Practitioners) who all work together to provide you with the care you need, when you need it.  Your next appointment:   Your physician wants you to follow-up in: 6 months with with one of the following Advanced Practice Providers on your designated Care Team:   Francis Dowse, PA-C Casimiro Needle "Mardelle Matte" [place], [place] You will receive a reminder letter in the mail two months in advance. If you don't receive a letter, please call our office to schedule the follow-up appointment.

## 2021-06-16 NOTE — Progress Notes (Signed)
Electrophysiology Office Follow up Visit Note:    Date:  06/16/2021   ID:  Kristin Hancock, DOB 10/05/1930, MRN [id number]  PCP:  Clovis Riley, Elbert Ewings.Kristin Saucer, MD  [facility] Cardiologist:  None  [facility] Electrophysiologist:  Lanier Prude, MD    Interval History:    Kristin Hancock is a 87 y.o. female who presents for a follow up visit. They were last seen in clinic December 30, 2020 for her persistent atrial fibrillation.  For her atrial fibrillation she is maintained on flecainide 75 mg by mouth twice daily and atenolol 100 mg by mouth daily.  She is on Eliquis 5 mg by mouth twice daily for stroke prophylaxis.  She has done well since I last saw her without recurrent episode of atrial fibrillation.     Past Medical History:  Diagnosis Date   Atrial fibrillation (HCC)    Hypercholesterolemia    Hypertension    Overactive bladder     Past Surgical History:  Procedure Laterality Date   CARDIOVERSION N/A 12/17/2020   Procedure: CARDIOVERSION;  Surgeon: Quintella Reichert, MD;  Location: [facility] ENDOSCOPY;  Service: Cardiovascular;  Laterality: N/A;    Current Medications: Current Meds  Medication Sig   amLODipine (NORVASC) 5 MG tablet Take 5 mg by mouth daily.   apixaban (ELIQUIS) 5 MG TABS tablet Take 1 tablet (5 mg total) by mouth 2 (two) times daily.   atenolol (TENORMIN) 100 MG tablet Take 100 mg by mouth daily.   flecainide (TAMBOCOR) 50 MG tablet TAKE 1 AND 1/2 TABLETS BY  MOUTH TWICE DAILY   hydrochlorothiazide (HYDRODIURIL) 12.5 MG tablet Take 12.5 mg by mouth daily.   losartan (COZAAR) 100 MG tablet Take 100 mg by mouth daily.   Multiple Vitamins-Minerals (PRESERVISION/LUTEIN) CAPS Take 1 capsule by mouth in the [place]ancock and at bedtime.   simvastatin (ZOCOR) 20 MG tablet Take 20 mg by mouth daily.     Allergies:   Patient has no known allergies.   Social History   Socioeconomic History   Marital status: Married    Spouse name: Not on file   Number of children:  Not on file   Years of education: Not on file   Highest education level: Not on file  Occupational History   Not on file  Tobacco Use   Smoking status: Never   Smokeless tobacco: Never  Substance and Sexual Activity   Alcohol use: Not on file   Drug use: Not on file   Sexual activity: Not on file  Other Topics Concern   Not on file  Social History Narrative   Not on file   Social Determinants of Health   Financial Resource Strain: Not on file  Food Insecurity: Not on file  Transportation Needs: Not on file  Physical Activity: Not on file  Stress: Not on file  Social Connections: Not on file     Family History: The patient's family history includes CAD in her brother; COPD in her father; Diabetes in her brother and maternal grandmother; Heart failure in her mother.  ROS:   Please see the history of present illness.    All other systems reviewed and are negative.  EKGs/Labs/Other Studies Reviewed:    The following studies were reviewed today:   EKG:  The ekg ordered today demonstrates sinus rhythm.  PR interval 200 ms.  QRS duration 100 ms.  QTc 474 ms.  Recent Labs: 12/07/2020: BUN 32; Creatinine, Ser 0.93; Hemoglobin 11.9; Platelets 193; Potassium 4.4; Sodium 140  Recent  Lipid Panel No results found for: CHOL, TRIG, HDL, CHOLHDL, VLDL, LDLCALC, LDLDIRECT  Physical Exam:    VS:  BP 122/64   Pulse 65   Ht 5\' 3"  (1.6 m)   Wt 160 lb 9.6 oz (72.8 kg)   SpO2 95%   BMI 28.45 kg/m     Wt Readings from Last 3 Encounters:  06/16/21 160 lb 9.6 oz (72.8 kg)  12/30/20 170 lb 9.6 oz (77.4 kg)  12/17/20 169 lb 12.1 oz (77 kg)     GEN:  Well nourished, well developed in no acute distress HEENT: Normal NECK: No JVD; No carotid bruits LYMPHATICS: No lymphadenopathy CARDIAC: RRR, no murmurs, rubs, gallops RESPIRATORY:  Clear to auscultation without rales, wheezing or rhonchi  ABDOMEN: Soft, non-tender, non-distended MUSCULOSKELETAL:  No edema; No deformity  SKIN:  Warm and dry NEUROLOGIC:  Alert and oriented x 3 PSYCHIATRIC:  Normal affect   ASSESSMENT:    1. Persistent atrial fibrillation (HCC)   2. Primary hypertension   3. Encounter for long-term (current) use of high-risk medication    PLAN:    In order of problems listed above:  1. Persistent atrial fibrillation (HCC) Maintaining sinus rhythm on flecainide and atenolol.  On Eliquis for stroke prophylaxis.  For now, continue current therapy.  I will have her see one of the PAs in 6 months for repeat ECG.  2. Primary hypertension Controlled.  Continue current regimen.  3. Encounter for long-term (current) use of high-risk medication Intervals normal on today's EKG.  Continue current regimen.    Follow-up 6 months with one of the PAs.     Medication Adjustments/Labs and Tests Ordered: Current medicines are reviewed at length with the patient today.  Concerns regarding medicines are outlined above.  Orders Placed This Encounter  Procedures   EKG 12-Lead   No orders of the defined types were placed in this encounter.    Signed, 12/19/20, MD, [facility], [facility] 06/16/2021 9:38 PM    Electrophysiology [facility]

## 2021-07-26 DIAGNOSIS — H26492 Other secondary cataract, left eye: Secondary | ICD-10-CM | POA: Diagnosis not present

## 2021-07-26 DIAGNOSIS — H35371 Puckering of macula, right eye: Secondary | ICD-10-CM | POA: Diagnosis not present

## 2021-07-26 DIAGNOSIS — H43813 Vitreous degeneration, bilateral: Secondary | ICD-10-CM | POA: Diagnosis not present

## 2021-07-26 DIAGNOSIS — H353133 Nonexudative age-related macular degeneration, bilateral, advanced atrophic without subfoveal involvement: Secondary | ICD-10-CM | POA: Diagnosis not present

## 2021-08-24 ENCOUNTER — Other Ambulatory Visit: Payer: Self-pay

## 2021-08-24 MED ORDER — LOSARTAN POTASSIUM 100 MG PO TABS: 100.0000 mg | ORAL_TABLET | Freq: Every day | ORAL | 2 refills | Status: DC

## 2021-08-24 MED ORDER — ATENOLOL 100 MG PO TABS: 100.0000 mg | ORAL_TABLET | Freq: Every day | ORAL | 2 refills | Status: DC

## 2021-08-24 MED ORDER — SIMVASTATIN 20 MG PO TABS: 20.0000 mg | ORAL_TABLET | Freq: Every day | ORAL | 2 refills | Status: DC

## 2021-08-24 MED ORDER — HYDROCHLOROTHIAZIDE 12.5 MG PO TABS: 12.5000 mg | ORAL_TABLET | Freq: Every day | ORAL | 2 refills | Status: DC

## 2021-08-24 MED ORDER — AMLODIPINE BESYLATE 5 MG PO TABS: 5.0000 mg | ORAL_TABLET | Freq: Every day | ORAL | 2 refills | Status: DC

## 2021-08-24 NOTE — Telephone Encounter (Signed)
Pt's medications was sent to pt's pharmacy as requested. Confirmation received.  

## 2021-10-10 ENCOUNTER — Telehealth: Payer: Self-pay | Admitting: Cardiology

## 2021-10-10 NOTE — Telephone Encounter (Signed)
Pt c/o Shortness Of Breath: STAT if SOB developed within the last 24 hours or pt is noticeably SOB on the phone  1. Are you currently SOB (can you hear that pt is SOB on the phone)? No, is sitting down   2. How long have you been experiencing SOB? Couple of weeks   3. Are you SOB when sitting or when up moving around? Anytime she is doing something   4. Are you currently experiencing any other symptoms? No

## 2021-10-10 NOTE — Telephone Encounter (Signed)
Patient complaining of SOB for a couple of weeks. This occurs with activity only. She complaining of becoming tired quickly with activity. Patient's current vital signs 148/59 63. Left foot has some swelling, for about 2-3 weeks. She does not weigh daily. The patient has not missed any medications. Scheduler set appt for 1/18 with Dr. Lalla Brothers.  Gave ED precautions.  Verbalized understanding.

## 2021-10-11 NOTE — Progress Notes (Deleted)
Electrophysiology Office Follow up Visit Note:    Date:  10/11/2021   ID:  Kristin Hancock, DOB [birth year], MRN [id number]  PCP:  Clovis Riley, L.August Saucer, MD  [facility] Cardiologist:  None  [facility] Electrophysiologist:  Lanier Prude, MD    Interval History:    Kristin Hancock is a 88 y.o. female who presents for a follow up visit. They were last seen in clinic June 16, 2021 for persistent atrial fibrillation.  She takes flecainide 75 mg by mouth twice daily and atenolol 100 mg by mouth once daily.  She takes Eliquis 5 mg by mouth twice daily for stroke prophylaxis.  She called into the office January 16 complaining of shortness of breath with exertion for several weeks.  She also complained of slight foot swelling.     Past Medical History:  Diagnosis Date   Atrial fibrillation (HCC)    Hypercholesterolemia    Hypertension    Overactive bladder     Past Surgical History:  Procedure Laterality Date   CARDIOVERSION N/A 12/17/2020   Procedure: CARDIOVERSION;  Surgeon: Quintella Reichert, MD;  Location: [facility] ENDOSCOPY;  Service: Cardiovascular;  Laterality: N/A;    Current Medications: No outpatient medications have been marked as taking for the 10/12/21 encounter (Appointment) with Lanier Prude, MD.     Allergies:   Patient has no known allergies.   Social History   Socioeconomic History   Marital status: Married    Spouse name: Not on file   Number of children: Not on file   Years of education: Not on file   Highest education level: Not on file  Occupational History   Not on file  Tobacco Use   Smoking status: Never   Smokeless tobacco: Never  Substance and Sexual Activity   Alcohol use: Not on file   Drug use: Not on file   Sexual activity: Not on file  Other Topics Concern   Not on file  Social History Narrative   Not on file   Social Determinants of Health   Financial Resource Strain: Not on file  Food Insecurity: Not on file   Transportation Needs: Not on file  Physical Activity: Not on file  Stress: Not on file  Social Connections: Not on file     Family History: The patient's family history includes CAD in her brother; COPD in her father; Diabetes in her brother and maternal grandmother; Heart failure in her mother.  ROS:   Please see the history of present illness.    All other systems reviewed and are negative.  EKGs/Labs/Other Studies Reviewed:    The following studies were reviewed today: ***  EKG:  The ekg ordered today demonstrates ***  Recent Labs: 12/07/2020: BUN 32; Creatinine, Ser 0.93; Hemoglobin 11.9; Platelets 193; Potassium 4.4; Sodium 140  Recent Lipid Panel No results found for: CHOL, TRIG, HDL, CHOLHDL, VLDL, LDLCALC, LDLDIRECT  Physical Exam:    VS:  There were no vitals taken for this visit.    Wt Readings from Last 3 Encounters:  06/16/21 160 lb 9.6 oz (72.8 kg)  12/30/20 170 lb 9.6 oz (77.4 kg)  12/17/20 169 lb 12.1 oz (77 kg)     GEN: *** Well nourished, well developed in no acute distress HEENT: Normal NECK: No JVD; No carotid bruits LYMPHATICS: No lymphadenopathy CARDIAC: ***RRR, no murmurs, rubs, gallops RESPIRATORY:  Clear to auscultation without rales, wheezing or rhonchi  ABDOMEN: Soft, non-tender, non-distended MUSCULOSKELETAL:  No edema; No deformity  SKIN: Warm and  dry NEUROLOGIC:  Alert and oriented x 3 PSYCHIATRIC:  Normal affect        ASSESSMENT:    No diagnosis found. PLAN:    In order of problems listed above:   Repeat echocardiogram Follow-up 6 to 8 weeks with a PA        Total time spent with patient today *** minutes. This includes reviewing records, evaluating the patient and coordinating care.   Medication Adjustments/Labs and Tests Ordered: Current medicines are reviewed at length with the patient today.  Concerns regarding medicines are outlined above.  No orders of the defined types were placed in this encounter.  No  orders of the defined types were placed in this encounter.    Signed, Steffanie Dunn, MD, [facility], [facility] 10/11/2021 9:08 PM    Electrophysiology [facility]

## 2021-10-12 ENCOUNTER — Other Ambulatory Visit: Payer: Self-pay

## 2021-10-12 ENCOUNTER — Ambulatory Visit: Payer: Medicare Other | Admitting: Cardiology

## 2021-10-12 ENCOUNTER — Encounter: Payer: Self-pay | Admitting: Cardiology

## 2021-10-12 VITALS — BP 122/70 | HR 65 | Ht 63.0 in | Wt 171.0 lb

## 2021-10-12 DIAGNOSIS — I4819 Other persistent atrial fibrillation: Secondary | ICD-10-CM | POA: Diagnosis not present

## 2021-10-12 DIAGNOSIS — R0609 Other forms of dyspnea: Secondary | ICD-10-CM

## 2021-10-12 DIAGNOSIS — I10 Essential (primary) hypertension: Secondary | ICD-10-CM | POA: Diagnosis not present

## 2021-10-12 NOTE — Patient Instructions (Signed)
Medication Instructions:  Your physician recommends that you continue on your current medications as directed. Please refer to the Current Medication list given to you today. *If you need a refill on your cardiac medications before your next appointment, please call your pharmacy*  Lab Work: None. If you have labs (blood work) drawn today and your tests are completely normal, you will receive your results only by: MyChart Message (if you have MyChart) OR A paper copy in the mail If you have any lab test that is abnormal or we need to change your treatment, we will call you to review the results.  Testing/Procedures: None.  Follow-Up: At [facility], you and your health needs are our priority.  As part of our continuing mission to provide you with exceptional heart care, we have created designated Provider Care Teams.  These Care Teams include your primary Cardiologist (physician) and Advanced Practice Providers (APPs -  Physician Assistants and Nurse Practitioners) who all work together to provide you with the care you need, when you need it.  Your physician wants you to follow-up in: 6 months with one of the following Advanced Practice Providers on your designated Care Team:    Renee Ursuy, PA-C Michael "Andy" [place], PA-C   We recommend signing up for the patient portal called "MyChart".  Sign up information is provided on this After Visit Summary.  MyChart is used to connect with patients for Virtual Visits (Telemedicine).  Patients are able to view lab/test results, encounter notes, upcoming appointments, etc.  Non-urgent messages can be sent to your provider as well.   To learn more about what you can do with MyChart, go to [email or web address].    Any Other Special Instructions Will Be Listed Below (If Applicable).         

## 2021-10-12 NOTE — Progress Notes (Signed)
Electrophysiology Office Follow up Visit Note:    Date:  10/12/2021   ID:  Kristin Hancock, DOB [birth year], MRN [id number]  PCP:  Alroy Dust, L.Marlou Sa, MD  [facility] Cardiologist:  None  [facility] Electrophysiologist:  Vickie Epley, MD    Interval History:    Kristin Hancock is a 88 y.o. female who presents for a follow up visit. They were last seen in clinic June 16, 2021 for persistent atrial fibrillation.  She takes flecainide 75 mg by mouth twice daily and atenolol 100 mg by mouth once daily.  She takes Eliquis 5 mg by mouth twice daily for stroke prophylaxis.  She called into the office January 16 complaining of shortness of breath with exertion for several weeks.  She also complained of slight foot swelling.  Overall she appears well. Recently her husband suffered a minor stroke, but he is doing better at this time. She endorses bilateral LE edema (L>R) mostly in her feet. This swelling is occurring every day. She denies any palpitations, or chest pain. No lightheadedness, headaches, syncope, orthopnea, or PND. Also, she notes having some difficulty with her vision, which she believes may be attributable to her flecainide. She cannot see as well as she did prior, and she is no longer able to drive at night.     Past Medical History:  Diagnosis Date   Atrial fibrillation ([place])    Hypercholesterolemia    Hypertension    Overactive bladder     Past Surgical History:  Procedure Laterality Date   CARDIOVERSION N/A 12/17/2020   Procedure: CARDIOVERSION;  Surgeon: Sueanne Margarita, MD;  Location: [facility] ENDOSCOPY;  Service: Cardiovascular;  Laterality: N/A;    Current Medications: Current Meds  Medication Sig   amLODipine (NORVASC) 5 MG tablet Take 1 tablet (5 mg total) by mouth daily.   apixaban (ELIQUIS) 5 MG TABS tablet Take 1 tablet (5 mg total) by mouth 2 (two) times daily.   atenolol (TENORMIN) 100 MG tablet Take 1 tablet (100 mg total) by mouth daily.    flecainide (TAMBOCOR) 50 MG tablet TAKE 1 AND 1/2 TABLETS BY  MOUTH TWICE DAILY   hydrochlorothiazide (HYDRODIURIL) 12.5 MG tablet Take 1 tablet (12.5 mg total) by mouth daily.   losartan (COZAAR) 100 MG tablet Take 1 tablet (100 mg total) by mouth daily.   Multiple Vitamins-Minerals (PRESERVISION/LUTEIN) CAPS Take 1 capsule by mouth in the morning and at bedtime.   simvastatin (ZOCOR) 20 MG tablet Take 1 tablet (20 mg total) by mouth daily.     Allergies:   Patient has no known allergies.   Social History   Socioeconomic History   Marital status: Married    Spouse name: Not on file   Number of children: Not on file   Years of education: Not on file   Highest education level: Not on file  Occupational History   Not on file  Tobacco Use   Smoking status: Never   Smokeless tobacco: Never  Substance and Sexual Activity   Alcohol use: Not on file   Drug use: Not on file   Sexual activity: Not on file  Other Topics Concern   Not on file  Social History Narrative   Not on file   Social Determinants of Health   Financial Resource Strain: Not on file  Food Insecurity: Not on file  Transportation Needs: Not on file  Physical Activity: Not on file  Stress: Not on file  Social Connections: Not on file  Family History: The patient's family history includes CAD in her brother; COPD in her father; Diabetes in her brother and maternal grandmother; Heart failure in her mother.  ROS:   Please see the history of present illness.   (+) Bilateral LE edema L>R  (+) Visual disturbance All other systems reviewed and are negative.  EKGs/Labs/Other Studies Reviewed:    The following studies were reviewed today:  TEE 01/09/2020 [facility]): Conclusions  1. There is normal left ventricular systolic function.  2. Possible thrombus seen in the left atrial appendage.  3. Spontaneous echo contrast is present in the left atrium appendage.  4. There is no patent foramen ovale  visualized.  5. Delayed appearance of saline contrast bubbles is noted in the left  atrium indicating intrapulmonary shunting.  minimal delayed bubbles  6. Mild spontaneous echo contrast is present in the right atrium.  7. There is mild mitral regurgitation observed.  8. There is mild tricuspid regurgitation.   Echo 01/05/2020 ([place]): Conclusions  1. There is moderate concentric left ventricular hypertrophy.  2. There is normal left ventricular systolic function. The estimated  ejection fraction is 60%.  3. Unable to evaluate diastolic function due to atrial arrythmia.  4. The left atrium is mildly dilated.  5. The right atrial cavity size is mildly dilated.  6. There is a trace of mitral regurgitation.  7. There is moderate tricuspid regurgitation.  8. There is moderate pulmonary hypertension. The pulmonary artery  systolic pressure is estimated at 50-60 mmHg.  9. There is no pericardial effusion.  10. The inferior vena cava is normal in size. There is a greater than  50% respiratory change in the inferior vena cava dimension.   Nuclear Stress 07/05/2015: Final conclusions: Normal Lexiscan nuclear stress test.  Normal myocardial perfusion of the left ventricle with Tc-61m sestamiibi imaging. Normal LV size. Global left ventricular systolic function was normal. The left ventricular ejection fraction was 73%. In addition, there was normal wall motion. Normal RV size. Normal RV function. No prior study was available for comparison. This is a low risk study.   No ECG changes consistent with ischemia. The patient experienced no active symptoms. Would consider findings clinically negative for ischemia.  EKG:   10/12/2021: Normal sinus rhythm.   Recent Labs: 12/07/2020: BUN 32; Creatinine, Ser 0.93; Hemoglobin 11.9; Platelets 193; Potassium 4.4; Sodium 140  Recent Lipid Panel No results found for: CHOL, TRIG, HDL, CHOLHDL, VLDL, LDLCALC, LDLDIRECT  Physical Exam:    VS:  BP  122/70    Pulse 65    Ht 5\' 3"  (1.6 m)    Wt 171 lb (77.6 kg)    LMP  (LMP Unknown)    SpO2 94%    BMI 30.29 kg/m     Wt Readings from Last 3 Encounters:  10/12/21 171 lb (77.6 kg)  06/16/21 160 lb 9.6 oz (72.8 kg)  12/30/20 170 lb 9.6 oz (77.4 kg)     GEN: Well nourished, well developed in no acute distress HEENT: Normal NECK: No JVD; No carotid bruits LYMPHATICS: No lymphadenopathy CARDIAC: RRR, no murmurs, rubs, gallops RESPIRATORY:  Clear to auscultation without rales, wheezing or rhonchi  ABDOMEN: Soft, non-tender, non-distended MUSCULOSKELETAL:  No edema; No deformity  SKIN: Warm and dry NEUROLOGIC:  Alert and oriented x 3 PSYCHIATRIC:  Normal affect        ASSESSMENT:    1. Persistent atrial fibrillation ([place])   2. Primary hypertension   3. Dyspnea on exertion  PLAN:    In order of problems listed above:  #Persistent atrial fibrillation In sinus rhythm today.  Continue flecainide, atenolol and Eliquis.  #Dyspnea on exertion I do not think this is related to her atrial arrhythmias.  She is euvolemic today.  #Hypertension Controlled Continue current regimen   Follow-up 6 months with an APP.      Medication Adjustments/Labs and Tests Ordered: Current medicines are reviewed at length with the patient today.  Concerns regarding medicines are outlined above.   Orders Placed This Encounter  Procedures   EKG 12-Lead   No orders of the defined types were placed in this encounter.  I,Mathew Stumpf,acting as a Education administrator for Vickie Epley, MD.,have documented all relevant documentation on the behalf of Vickie Epley, MD,as directed by  Vickie Epley, MD while in the presence of Vickie Epley, MD.  I, Vickie Epley, MD, have reviewed all documentation for this visit. The documentation on 10/12/21 for the exam, diagnosis, procedures, and orders are all accurate and complete.   Signed, Lars Mage, MD, [facility], [facility] 10/12/2021 2:37 PM     Electrophysiology [facility]

## 2021-10-13 ENCOUNTER — Other Ambulatory Visit: Payer: Self-pay | Admitting: Cardiology

## 2021-11-10 ENCOUNTER — Other Ambulatory Visit: Payer: Self-pay | Admitting: Cardiology

## 2021-11-10 DIAGNOSIS — M545 Low back pain, unspecified: Secondary | ICD-10-CM | POA: Diagnosis not present

## 2021-11-10 DIAGNOSIS — Z0000 Encounter for general adult medical examination without abnormal findings: Secondary | ICD-10-CM | POA: Diagnosis not present

## 2021-11-10 DIAGNOSIS — I10 Essential (primary) hypertension: Secondary | ICD-10-CM | POA: Diagnosis not present

## 2021-11-10 DIAGNOSIS — R0600 Dyspnea, unspecified: Secondary | ICD-10-CM | POA: Diagnosis not present

## 2021-11-10 DIAGNOSIS — I4891 Unspecified atrial fibrillation: Secondary | ICD-10-CM | POA: Diagnosis not present

## 2021-11-10 DIAGNOSIS — E7800 Pure hypercholesterolemia, unspecified: Secondary | ICD-10-CM | POA: Diagnosis not present

## 2021-11-10 DIAGNOSIS — R8290 Unspecified abnormal findings in urine: Secondary | ICD-10-CM | POA: Diagnosis not present

## 2021-11-11 NOTE — Telephone Encounter (Signed)
Pt last saw Dr Quentin Ore 10/12/21, last labs 05/04/21 Creat 0.82, age 88, weight 77.6kg, based on specified criteria pt is on appropriate dosage of Eliquis 5mg  BID for afib.  Will refill rx.

## 2021-11-24 ENCOUNTER — Other Ambulatory Visit: Payer: Self-pay | Admitting: Family Medicine

## 2021-11-24 ENCOUNTER — Ambulatory Visit
Admission: RE | Admit: 2021-11-24 | Discharge: 2021-11-24 | Disposition: A | Discharge status: Home / Self Care | Payer: Medicare Other | Source: Ambulatory Visit | Attending: Family Medicine | Admitting: Family Medicine

## 2021-11-24 DIAGNOSIS — R0609 Other forms of dyspnea: Secondary | ICD-10-CM

## 2021-11-24 DIAGNOSIS — I739 Peripheral vascular disease, unspecified: Secondary | ICD-10-CM | POA: Diagnosis not present

## 2021-11-24 DIAGNOSIS — I10 Essential (primary) hypertension: Secondary | ICD-10-CM | POA: Diagnosis not present

## 2021-11-24 DIAGNOSIS — R5383 Other fatigue: Secondary | ICD-10-CM | POA: Diagnosis not present

## 2021-11-24 DIAGNOSIS — R0600 Dyspnea, unspecified: Secondary | ICD-10-CM | POA: Diagnosis not present

## 2021-11-24 DIAGNOSIS — I4891 Unspecified atrial fibrillation: Secondary | ICD-10-CM | POA: Diagnosis not present

## 2021-11-24 DIAGNOSIS — R0602 Shortness of breath: Secondary | ICD-10-CM | POA: Diagnosis not present

## 2021-12-15 ENCOUNTER — Telehealth: Payer: Self-pay | Admitting: Cardiology

## 2021-12-15 MED ORDER — ATENOLOL 50 MG PO TABS: 50.0000 mg | ORAL_TABLET | Freq: Every day | ORAL | 3 refills | Status: DC

## 2021-12-15 NOTE — Telephone Encounter (Signed)
Patient called to talk with Dr. Quentin Ore or nurse in regards to her medication atenolol (TENORMIN) 100 MG tablet ?

## 2021-12-15 NOTE — Telephone Encounter (Signed)
Spoke with patient about a change in her Atenolol 100mg  QD. ? ?Patient states she saw Dr. at a primary care visit either end of February/early March. She states her Atenolol dose was cut in half to 50mg  and she has been taking Atenolol 50mg  daily since 11/25/21.  ? ?Patient reports her fatigue and SOB have improved since taking 50mg  of Atenolol. ? ?BP/HR readings from the past week as follows: ?3/23--144/70, 67 ?3/22--AM 119/64, 65 (PM 119/59, 62) ?3/21--AM 141/73, 63 ?3/20--AM 132/64, 63 (PM 125/67, 65) ?3/19--AM 139/74, 63 (PM 150/71, 67) ?3/18--AM 121/62, 64 (PM 120/62, 66) ?3/17--AM 148/70, 72 (PM 158/77, 71) ?3/16--AM 140/64, 69 (PM 147/70, 67) ? ?Patient called to make Dr. 01/25/22 aware of this change and to see if he agreed with her taking Atenolol 50mg  QD. She also requests, if he does agree, that a new prescription for 50mg  tablets be sent in so she does not have to cut the 100mg  tablets in half. ? ?Will forward to Dr. to review and advise. ? ?Patient verbalized understanding. ?

## 2021-12-15 NOTE — Telephone Encounter (Signed)
Spoke with patient regarding the following change to Atenolol: ? ?Lanier Prude, MD  You; Sampson Goon, RN 1 hour ago (12:44 PM)  ? ?Agree with change. OK with refill.  ?Thanks,  ?Steffanie Dunn   ? ?Atenolol 100mg  QD discontinued. Ordered Atenolol 50mg  QD per Dr. . Sent to pharmacy of choice. ? ?Patient verbalized understanding. ?

## 2022-01-18 DIAGNOSIS — H353213 Exudative age-related macular degeneration, right eye, with inactive scar: Secondary | ICD-10-CM | POA: Diagnosis not present

## 2022-01-18 DIAGNOSIS — H353121 Nonexudative age-related macular degeneration, left eye, early dry stage: Secondary | ICD-10-CM | POA: Diagnosis not present

## 2022-01-24 DIAGNOSIS — H35371 Puckering of macula, right eye: Secondary | ICD-10-CM | POA: Diagnosis not present

## 2022-01-24 DIAGNOSIS — H353133 Nonexudative age-related macular degeneration, bilateral, advanced atrophic without subfoveal involvement: Secondary | ICD-10-CM | POA: Diagnosis not present

## 2022-01-24 DIAGNOSIS — H26492 Other secondary cataract, left eye: Secondary | ICD-10-CM | POA: Diagnosis not present

## 2022-01-24 DIAGNOSIS — H43813 Vitreous degeneration, bilateral: Secondary | ICD-10-CM | POA: Diagnosis not present

## 2022-04-01 ENCOUNTER — Other Ambulatory Visit: Payer: Self-pay | Admitting: Cardiology

## 2022-04-13 DIAGNOSIS — M79604 Pain in right leg: Secondary | ICD-10-CM | POA: Diagnosis not present

## 2022-04-13 DIAGNOSIS — R2689 Other abnormalities of gait and mobility: Secondary | ICD-10-CM | POA: Diagnosis not present

## 2022-04-25 DIAGNOSIS — M6281 Muscle weakness (generalized): Secondary | ICD-10-CM | POA: Diagnosis not present

## 2022-04-25 DIAGNOSIS — R2681 Unsteadiness on feet: Secondary | ICD-10-CM | POA: Diagnosis not present

## 2022-04-25 DIAGNOSIS — M79604 Pain in right leg: Secondary | ICD-10-CM | POA: Diagnosis not present

## 2022-04-25 DIAGNOSIS — R2689 Other abnormalities of gait and mobility: Secondary | ICD-10-CM | POA: Diagnosis not present

## 2022-04-27 DIAGNOSIS — R2681 Unsteadiness on feet: Secondary | ICD-10-CM | POA: Diagnosis not present

## 2022-04-27 DIAGNOSIS — M6281 Muscle weakness (generalized): Secondary | ICD-10-CM | POA: Diagnosis not present

## 2022-04-27 DIAGNOSIS — M79604 Pain in right leg: Secondary | ICD-10-CM | POA: Diagnosis not present

## 2022-04-27 DIAGNOSIS — R2689 Other abnormalities of gait and mobility: Secondary | ICD-10-CM | POA: Diagnosis not present

## 2022-04-28 ENCOUNTER — Other Ambulatory Visit: Payer: Self-pay | Admitting: Cardiology

## 2022-05-01 DIAGNOSIS — M79604 Pain in right leg: Secondary | ICD-10-CM | POA: Diagnosis not present

## 2022-05-01 DIAGNOSIS — M6281 Muscle weakness (generalized): Secondary | ICD-10-CM | POA: Diagnosis not present

## 2022-05-01 DIAGNOSIS — R2681 Unsteadiness on feet: Secondary | ICD-10-CM | POA: Diagnosis not present

## 2022-05-01 DIAGNOSIS — R2689 Other abnormalities of gait and mobility: Secondary | ICD-10-CM | POA: Diagnosis not present

## 2022-05-01 NOTE — Telephone Encounter (Signed)
Prescription refill request for Eliquis received. Indication: Atrial Fib Last office visit: 10/12/21  Jeanie Cooks MD Scr: 0.85 on 11/10/21 Age: 88 Weight: 77.6kg  Based on above findings Eliquis 5mg  twice daily is the appropriate dose.  Refill approved.

## 2022-05-08 DIAGNOSIS — R2681 Unsteadiness on feet: Secondary | ICD-10-CM | POA: Diagnosis not present

## 2022-05-08 DIAGNOSIS — M6281 Muscle weakness (generalized): Secondary | ICD-10-CM | POA: Diagnosis not present

## 2022-05-08 DIAGNOSIS — R2689 Other abnormalities of gait and mobility: Secondary | ICD-10-CM | POA: Diagnosis not present

## 2022-05-08 DIAGNOSIS — M79604 Pain in right leg: Secondary | ICD-10-CM | POA: Diagnosis not present

## 2022-05-11 DIAGNOSIS — M25562 Pain in left knee: Secondary | ICD-10-CM | POA: Diagnosis not present

## 2022-05-11 DIAGNOSIS — I4891 Unspecified atrial fibrillation: Secondary | ICD-10-CM | POA: Diagnosis not present

## 2022-05-11 DIAGNOSIS — R809 Proteinuria, unspecified: Secondary | ICD-10-CM | POA: Diagnosis not present

## 2022-05-11 DIAGNOSIS — E7800 Pure hypercholesterolemia, unspecified: Secondary | ICD-10-CM | POA: Diagnosis not present

## 2022-05-11 DIAGNOSIS — M79604 Pain in right leg: Secondary | ICD-10-CM | POA: Diagnosis not present

## 2022-05-11 DIAGNOSIS — R2689 Other abnormalities of gait and mobility: Secondary | ICD-10-CM | POA: Diagnosis not present

## 2022-05-11 DIAGNOSIS — I10 Essential (primary) hypertension: Secondary | ICD-10-CM | POA: Diagnosis not present

## 2022-05-11 DIAGNOSIS — R2681 Unsteadiness on feet: Secondary | ICD-10-CM | POA: Diagnosis not present

## 2022-05-11 DIAGNOSIS — M6281 Muscle weakness (generalized): Secondary | ICD-10-CM | POA: Diagnosis not present

## 2022-05-11 DIAGNOSIS — N3281 Overactive bladder: Secondary | ICD-10-CM | POA: Diagnosis not present

## 2022-05-12 ENCOUNTER — Encounter: Payer: Self-pay | Admitting: Cardiology

## 2022-05-15 DIAGNOSIS — M6281 Muscle weakness (generalized): Secondary | ICD-10-CM | POA: Diagnosis not present

## 2022-05-15 DIAGNOSIS — R2689 Other abnormalities of gait and mobility: Secondary | ICD-10-CM | POA: Diagnosis not present

## 2022-05-15 DIAGNOSIS — R2681 Unsteadiness on feet: Secondary | ICD-10-CM | POA: Diagnosis not present

## 2022-05-15 DIAGNOSIS — M79604 Pain in right leg: Secondary | ICD-10-CM | POA: Diagnosis not present

## 2022-05-18 DIAGNOSIS — R2681 Unsteadiness on feet: Secondary | ICD-10-CM | POA: Diagnosis not present

## 2022-05-18 DIAGNOSIS — R2689 Other abnormalities of gait and mobility: Secondary | ICD-10-CM | POA: Diagnosis not present

## 2022-05-18 DIAGNOSIS — M79604 Pain in right leg: Secondary | ICD-10-CM | POA: Diagnosis not present

## 2022-05-18 DIAGNOSIS — M6281 Muscle weakness (generalized): Secondary | ICD-10-CM | POA: Diagnosis not present

## 2022-05-22 DIAGNOSIS — R2689 Other abnormalities of gait and mobility: Secondary | ICD-10-CM | POA: Diagnosis not present

## 2022-05-22 DIAGNOSIS — M6281 Muscle weakness (generalized): Secondary | ICD-10-CM | POA: Diagnosis not present

## 2022-05-22 DIAGNOSIS — R2681 Unsteadiness on feet: Secondary | ICD-10-CM | POA: Diagnosis not present

## 2022-05-22 DIAGNOSIS — M79604 Pain in right leg: Secondary | ICD-10-CM | POA: Diagnosis not present

## 2022-05-23 ENCOUNTER — Other Ambulatory Visit: Payer: Self-pay | Admitting: Cardiology

## 2022-05-25 DIAGNOSIS — R2689 Other abnormalities of gait and mobility: Secondary | ICD-10-CM | POA: Diagnosis not present

## 2022-05-25 DIAGNOSIS — R2681 Unsteadiness on feet: Secondary | ICD-10-CM | POA: Diagnosis not present

## 2022-05-25 DIAGNOSIS — M79604 Pain in right leg: Secondary | ICD-10-CM | POA: Diagnosis not present

## 2022-05-25 DIAGNOSIS — M6281 Muscle weakness (generalized): Secondary | ICD-10-CM | POA: Diagnosis not present

## 2022-06-01 DIAGNOSIS — R2681 Unsteadiness on feet: Secondary | ICD-10-CM | POA: Diagnosis not present

## 2022-06-01 DIAGNOSIS — M6281 Muscle weakness (generalized): Secondary | ICD-10-CM | POA: Diagnosis not present

## 2022-06-01 DIAGNOSIS — R2689 Other abnormalities of gait and mobility: Secondary | ICD-10-CM | POA: Diagnosis not present

## 2022-06-01 DIAGNOSIS — M79604 Pain in right leg: Secondary | ICD-10-CM | POA: Diagnosis not present

## 2022-06-05 DIAGNOSIS — R2689 Other abnormalities of gait and mobility: Secondary | ICD-10-CM | POA: Diagnosis not present

## 2022-06-05 DIAGNOSIS — R2681 Unsteadiness on feet: Secondary | ICD-10-CM | POA: Diagnosis not present

## 2022-06-05 DIAGNOSIS — M79604 Pain in right leg: Secondary | ICD-10-CM | POA: Diagnosis not present

## 2022-06-05 DIAGNOSIS — M6281 Muscle weakness (generalized): Secondary | ICD-10-CM | POA: Diagnosis not present

## 2022-06-08 DIAGNOSIS — M6281 Muscle weakness (generalized): Secondary | ICD-10-CM | POA: Diagnosis not present

## 2022-06-08 DIAGNOSIS — R2681 Unsteadiness on feet: Secondary | ICD-10-CM | POA: Diagnosis not present

## 2022-06-08 DIAGNOSIS — M79604 Pain in right leg: Secondary | ICD-10-CM | POA: Diagnosis not present

## 2022-06-08 DIAGNOSIS — R2689 Other abnormalities of gait and mobility: Secondary | ICD-10-CM | POA: Diagnosis not present

## 2022-06-12 DIAGNOSIS — R2689 Other abnormalities of gait and mobility: Secondary | ICD-10-CM | POA: Diagnosis not present

## 2022-06-12 DIAGNOSIS — M79604 Pain in right leg: Secondary | ICD-10-CM | POA: Diagnosis not present

## 2022-06-12 DIAGNOSIS — M6281 Muscle weakness (generalized): Secondary | ICD-10-CM | POA: Diagnosis not present

## 2022-06-12 DIAGNOSIS — R2681 Unsteadiness on feet: Secondary | ICD-10-CM | POA: Diagnosis not present

## 2022-06-21 DIAGNOSIS — M6281 Muscle weakness (generalized): Secondary | ICD-10-CM | POA: Diagnosis not present

## 2022-06-21 DIAGNOSIS — R2689 Other abnormalities of gait and mobility: Secondary | ICD-10-CM | POA: Diagnosis not present

## 2022-06-21 DIAGNOSIS — R2681 Unsteadiness on feet: Secondary | ICD-10-CM | POA: Diagnosis not present

## 2022-06-21 DIAGNOSIS — M79604 Pain in right leg: Secondary | ICD-10-CM | POA: Diagnosis not present

## 2022-06-26 DIAGNOSIS — M6281 Muscle weakness (generalized): Secondary | ICD-10-CM | POA: Diagnosis not present

## 2022-06-26 DIAGNOSIS — R2689 Other abnormalities of gait and mobility: Secondary | ICD-10-CM | POA: Diagnosis not present

## 2022-06-26 DIAGNOSIS — M79604 Pain in right leg: Secondary | ICD-10-CM | POA: Diagnosis not present

## 2022-06-26 DIAGNOSIS — R2681 Unsteadiness on feet: Secondary | ICD-10-CM | POA: Diagnosis not present

## 2022-07-06 DIAGNOSIS — M6281 Muscle weakness (generalized): Secondary | ICD-10-CM | POA: Diagnosis not present

## 2022-07-06 DIAGNOSIS — R2689 Other abnormalities of gait and mobility: Secondary | ICD-10-CM | POA: Diagnosis not present

## 2022-07-06 DIAGNOSIS — M79604 Pain in right leg: Secondary | ICD-10-CM | POA: Diagnosis not present

## 2022-07-06 DIAGNOSIS — R2681 Unsteadiness on feet: Secondary | ICD-10-CM | POA: Diagnosis not present

## 2022-07-06 NOTE — Progress Notes (Signed)
PCP:  Alroy Dust, L.Marlou Sa, MD Primary Cardiologist: None Electrophysiologist: Kristin Epley, MD   Kristin Hancock is a 88 y.o. female seen today for Kristin Epley, MD for routine electrophysiology followup. Since last being seen in our clinic the patient reports doing well overall. She has chronic dyspnea with more than mild exertion. Usually takes a break after making bed or bringing in groceries.  she denies chest pain, palpitations, dyspnea, PND, orthopnea, nausea, vomiting, dizziness, syncope, edema, weight gain, or early satiety.   Past Medical History:  Diagnosis Date   Atrial fibrillation ([place])    Hypercholesterolemia    Hypertension    Overactive bladder    Past Surgical History:  Procedure Laterality Date   CARDIOVERSION N/A 12/17/2020   Procedure: CARDIOVERSION;  Surgeon: Sueanne Margarita, MD;  Location: [facility] ENDOSCOPY;  Service: Cardiovascular;  Laterality: N/A;    Current Outpatient Medications  Medication Sig Dispense Refill   amLODipine (NORVASC) 5 MG tablet TAKE 1 TABLET BY MOUTH DAILY 90 tablet 1   apixaban (ELIQUIS) 5 MG TABS tablet TAKE 1 TABLET BY MOUTH TWICE  DAILY 180 tablet 1   atenolol (TENORMIN) 50 MG tablet Take 1 tablet (50 mg total) by mouth daily. 90 tablet 3   flecainide (TAMBOCOR) 50 MG tablet TAKE 1 AND 1/2 TABLETS BY  MOUTH TWICE DAILY 270 tablet 3   hydrochlorothiazide (HYDRODIURIL) 12.5 MG tablet TAKE 1 TABLET BY MOUTH DAILY 90 tablet 1   losartan (COZAAR) 100 MG tablet TAKE 1 TABLET BY MOUTH DAILY 90 tablet 1   Multiple Vitamins-Minerals (PRESERVISION/LUTEIN) CAPS Take 1 capsule by mouth in the morning and at bedtime.     simvastatin (ZOCOR) 20 MG tablet TAKE 1 TABLET BY MOUTH DAILY 90 tablet 1   No current facility-administered medications for this visit.    No Known Allergies  Social History   Socioeconomic History   Marital status: Married    Spouse name: Not on file   Number of children: Not on file   Years of education: Not on  file   Highest education level: Not on file  Occupational History   Not on file  Tobacco Use   Smoking status: Never   Smokeless tobacco: Never  Substance and Sexual Activity   Alcohol use: Not on file   Drug use: Not on file   Sexual activity: Not on file  Other Topics Concern   Not on file  Social History Narrative   Not on file   Social Determinants of Health   Financial Resource Strain: Not on file  Food Insecurity: Not on file  Transportation Needs: Not on file  Physical Activity: Not on file  Stress: Not on file  Social Connections: Not on file  Intimate Partner Violence: Not on file     Review of Systems: All other systems reviewed and are otherwise negative except as noted above.  Physical Exam: Vitals:   07/07/22 1019  BP: (!) 154/68  Pulse: 68  Weight: 172 lb (78 kg)  Height: 5' 1.5" (1.562 m)    GEN- The patient is well appearing, alert and oriented x 3 today.   HEENT: normocephalic, atraumatic; sclera clear, conjunctiva pink; hearing intact; oropharynx clear; neck supple, no JVP Lymph- no cervical lymphadenopathy Lungs- Clear to ausculation bilaterally, normal work of breathing.  No wheezes, rales, rhonchi Heart- Regular rate and rhythm, no murmurs, rubs or gallops, PMI not laterally displaced GI- soft, non-tender, non-distended, bowel sounds present, no hepatosplenomegaly Extremities- No peripheral edema. no clubbing or  cyanosis; DP/PT/radial pulses 2+ bilaterally MS- no significant deformity or atrophy Skin- warm and dry, no rash or lesion Psych- euthymic mood, full affect Neuro- strength and sensation are intact  EKG is ordered. Personal review of EKG from today shows NSR 68 bpm with stable intervals   Additional studies reviewed include: Previous EP office notes.   Assessment and Plan:  1. Persistent AF EKG today shows NSR with stable intervals on flecainide. Continue flecainide Continue atenolol Continue Eliquis  2. DOE Volume status  stable  NYHA II-III symptoms Echo 12/2019 with normal EF Encouraged increasing activity as tolerated  3. HTN Stable on current regimen   Follow up with Dr. Quentin Ore in 6 months, sooner with issues.  Shirley Friar, PA-C  07/07/22 10:28 AM

## 2022-07-07 ENCOUNTER — Ambulatory Visit: Payer: Medicare Other | Attending: Student | Admitting: Student

## 2022-07-07 ENCOUNTER — Encounter: Payer: Self-pay | Admitting: Student

## 2022-07-07 VITALS — BP 154/68 | HR 68 | Ht 61.5 in | Wt 172.0 lb

## 2022-07-07 DIAGNOSIS — R0609 Other forms of dyspnea: Secondary | ICD-10-CM | POA: Diagnosis not present

## 2022-07-07 DIAGNOSIS — I4819 Other persistent atrial fibrillation: Secondary | ICD-10-CM | POA: Diagnosis not present

## 2022-07-07 DIAGNOSIS — I10 Essential (primary) hypertension: Secondary | ICD-10-CM | POA: Diagnosis not present

## 2022-07-07 NOTE — Patient Instructions (Signed)
Medication Instructions:  Your physician recommends that you continue on your current medications as directed. Please refer to the Current Medication list given to you today.  *If you need a refill on your cardiac medications before your next appointment, please call your pharmacy*   Lab Work: None If you have labs (blood work) drawn today and your tests are completely normal, you will receive your results only by: [place] (if you have MyChart) OR A paper copy in the mail If you have any lab test that is abnormal or we need to change your treatment, we will call you to review the results.   Follow-Up: At [facility], you and your health needs are our priority.  As part of our continuing mission to provide you with exceptional heart care, we have created designated Provider Care Teams.  These Care Teams include your primary Cardiologist (physician) and Advanced Practice Providers (APPs -  Physician Assistants and Nurse Practitioners) who all work together to provide you with the care you need, when you need it.  We recommend signing up for the patient portal called "MyChart".  Sign up information is provided on this After Visit Summary.  MyChart is used to connect with patients for Virtual Visits (Telemedicine).  Patients are able to view lab/test results, encounter notes, upcoming appointments, etc.  Non-urgent messages can be sent to your provider as well.   To learn more about what you can do with MyChart, go to [email or web address].    Your next appointment:   6 month(s)  The format for your next appointment:   In Person  Provider:   Lars Mage, MD or Beryle Beams" Chalmers Cater, [place]     Important Information About Sugar

## 2022-07-10 DIAGNOSIS — M6281 Muscle weakness (generalized): Secondary | ICD-10-CM | POA: Diagnosis not present

## 2022-07-10 DIAGNOSIS — R2689 Other abnormalities of gait and mobility: Secondary | ICD-10-CM | POA: Diagnosis not present

## 2022-07-10 DIAGNOSIS — M79604 Pain in right leg: Secondary | ICD-10-CM | POA: Diagnosis not present

## 2022-07-10 DIAGNOSIS — R2681 Unsteadiness on feet: Secondary | ICD-10-CM | POA: Diagnosis not present

## 2022-07-13 DIAGNOSIS — R2681 Unsteadiness on feet: Secondary | ICD-10-CM | POA: Diagnosis not present

## 2022-07-13 DIAGNOSIS — M6281 Muscle weakness (generalized): Secondary | ICD-10-CM | POA: Diagnosis not present

## 2022-07-13 DIAGNOSIS — R2689 Other abnormalities of gait and mobility: Secondary | ICD-10-CM | POA: Diagnosis not present

## 2022-07-13 DIAGNOSIS — M79604 Pain in right leg: Secondary | ICD-10-CM | POA: Diagnosis not present

## 2022-07-17 DIAGNOSIS — M6281 Muscle weakness (generalized): Secondary | ICD-10-CM | POA: Diagnosis not present

## 2022-07-17 DIAGNOSIS — R2689 Other abnormalities of gait and mobility: Secondary | ICD-10-CM | POA: Diagnosis not present

## 2022-07-17 DIAGNOSIS — M79604 Pain in right leg: Secondary | ICD-10-CM | POA: Diagnosis not present

## 2022-07-17 DIAGNOSIS — R2681 Unsteadiness on feet: Secondary | ICD-10-CM | POA: Diagnosis not present

## 2022-07-20 DIAGNOSIS — M79604 Pain in right leg: Secondary | ICD-10-CM | POA: Diagnosis not present

## 2022-07-20 DIAGNOSIS — R2689 Other abnormalities of gait and mobility: Secondary | ICD-10-CM | POA: Diagnosis not present

## 2022-07-20 DIAGNOSIS — M6281 Muscle weakness (generalized): Secondary | ICD-10-CM | POA: Diagnosis not present

## 2022-07-20 DIAGNOSIS — R2681 Unsteadiness on feet: Secondary | ICD-10-CM | POA: Diagnosis not present

## 2022-07-21 DIAGNOSIS — H43813 Vitreous degeneration, bilateral: Secondary | ICD-10-CM | POA: Diagnosis not present

## 2022-07-21 DIAGNOSIS — H26492 Other secondary cataract, left eye: Secondary | ICD-10-CM | POA: Diagnosis not present

## 2022-07-21 DIAGNOSIS — H353133 Nonexudative age-related macular degeneration, bilateral, advanced atrophic without subfoveal involvement: Secondary | ICD-10-CM | POA: Diagnosis not present

## 2022-07-21 DIAGNOSIS — H35371 Puckering of macula, right eye: Secondary | ICD-10-CM | POA: Diagnosis not present

## 2022-09-11 ENCOUNTER — Other Ambulatory Visit: Payer: Self-pay | Admitting: Cardiology

## 2022-10-12 ENCOUNTER — Other Ambulatory Visit: Payer: Self-pay | Admitting: Cardiology

## 2022-10-25 ENCOUNTER — Other Ambulatory Visit: Payer: Self-pay | Admitting: Cardiology

## 2022-10-25 DIAGNOSIS — I4819 Other persistent atrial fibrillation: Secondary | ICD-10-CM

## 2022-10-25 NOTE — Telephone Encounter (Signed)
Prescription refill request for Eliquis received. Indication: Afib  Last office visit: 07/07/22 ([place])  Scr: 0.83 (05/11/22 via PCP) Age: 89 Weight: 78kg  Appropriate dose. Refill sent.

## 2022-11-03 DIAGNOSIS — H35371 Puckering of macula, right eye: Secondary | ICD-10-CM | POA: Diagnosis not present

## 2022-11-03 DIAGNOSIS — H353133 Nonexudative age-related macular degeneration, bilateral, advanced atrophic without subfoveal involvement: Secondary | ICD-10-CM | POA: Diagnosis not present

## 2022-11-03 DIAGNOSIS — H43813 Vitreous degeneration, bilateral: Secondary | ICD-10-CM | POA: Diagnosis not present

## 2022-11-14 DIAGNOSIS — Z23 Encounter for immunization: Secondary | ICD-10-CM | POA: Diagnosis not present

## 2022-11-14 DIAGNOSIS — N3281 Overactive bladder: Secondary | ICD-10-CM | POA: Diagnosis not present

## 2022-11-14 DIAGNOSIS — E7800 Pure hypercholesterolemia, unspecified: Secondary | ICD-10-CM | POA: Diagnosis not present

## 2022-11-14 DIAGNOSIS — Z0000 Encounter for general adult medical examination without abnormal findings: Secondary | ICD-10-CM | POA: Diagnosis not present

## 2022-11-14 DIAGNOSIS — Z130 Encounter for screening for diseases of the blood and blood-forming organs and certain disorders involving the immune mechanism: Secondary | ICD-10-CM | POA: Diagnosis not present

## 2022-11-14 DIAGNOSIS — H918X3 Other specified hearing loss, bilateral: Secondary | ICD-10-CM | POA: Diagnosis not present

## 2022-11-14 DIAGNOSIS — I10 Essential (primary) hypertension: Secondary | ICD-10-CM | POA: Diagnosis not present

## 2022-11-14 DIAGNOSIS — H3530 Unspecified macular degeneration: Secondary | ICD-10-CM | POA: Diagnosis not present

## 2022-11-14 DIAGNOSIS — I4819 Other persistent atrial fibrillation: Secondary | ICD-10-CM | POA: Diagnosis not present

## 2022-11-14 DIAGNOSIS — R809 Proteinuria, unspecified: Secondary | ICD-10-CM | POA: Diagnosis not present

## 2022-12-11 DIAGNOSIS — H903 Sensorineural hearing loss, bilateral: Secondary | ICD-10-CM | POA: Diagnosis not present

## 2023-01-02 DIAGNOSIS — H35371 Puckering of macula, right eye: Secondary | ICD-10-CM | POA: Diagnosis not present

## 2023-01-02 DIAGNOSIS — H43813 Vitreous degeneration, bilateral: Secondary | ICD-10-CM | POA: Diagnosis not present

## 2023-01-02 DIAGNOSIS — H353133 Nonexudative age-related macular degeneration, bilateral, advanced atrophic without subfoveal involvement: Secondary | ICD-10-CM | POA: Diagnosis not present

## 2023-01-02 NOTE — Progress Notes (Signed)
  Electrophysiology Office Note:   Date:  01/08/2023  ID:  Kristin Hancock, DOB 06-02-1931, MRN [id number]  Primary Cardiologist: None Electrophysiologist: Lanier Prude, MD   History of Present Illness:   Kristin Hancock is a 89 y.o. female with h/o persistent AF, HTN, and HLD seen today for routine electrophysiology followup. Since last being seen in our clinic the patient reports doing very well from a cardiac perspective.  she denies chest pain, palpitations, dyspnea, PND, orthopnea, nausea, vomiting, dizziness, syncope, edema, weight gain, or early satiety.   Review of systems complete and found to be negative unless listed in HPI.   Studies Reviewed:    EKG is ordered today. Personal review shows NSR at 62 bpm    Risk Assessment/Calculations:             Physical Exam:   VS:  BP 132/68   Pulse (!) 54   Ht 5' 1.5" (1.562 m)   Wt 175 lb (79.4 kg)   LMP  (LMP Unknown)   SpO2 97%   BMI 32.53 kg/m    Wt Readings from Last 3 Encounters:  01/08/23 175 lb (79.4 kg)  07/07/22 172 lb (78 kg)  10/12/21 171 lb (77.6 kg)     GEN: Well nourished, well developed in no acute distress NECK: No JVD; No carotid bruits CARDIAC: Regular rate and rhythm, no murmurs, rubs, gallops RESPIRATORY:  Clear to auscultation without rales, wheezing or rhonchi  ABDOMEN: Soft, non-tender, non-distended EXTREMITIES:  No edema; No deformity   ASSESSMENT AND PLAN:    Persistent AF EKG today shows NSR  Continue flecainide 75 mg BID Continue atenolol 50 mg daily Continue Eliquis 5 mg BID for CHA2DS2/VASc of at least 4.  BMET/CBC today.     DOE Volume status stable on exam. NYHA II-III symptoms Echo 12/2019 with normal EF Encouraged increasing activity as tolerated   HTN Stable on current regimen    Follow up with Dr. Lalla Brothers in 6 months  Signed, Graciella Freer, PA-C

## 2023-01-08 ENCOUNTER — Ambulatory Visit: Payer: Medicare Other | Attending: Student | Admitting: Student

## 2023-01-08 ENCOUNTER — Encounter: Payer: Self-pay | Admitting: Student

## 2023-01-08 VITALS — BP 132/68 | HR 54 | Ht 61.5 in | Wt 175.0 lb

## 2023-01-08 DIAGNOSIS — I4819 Other persistent atrial fibrillation: Secondary | ICD-10-CM

## 2023-01-08 DIAGNOSIS — R0609 Other forms of dyspnea: Secondary | ICD-10-CM

## 2023-01-08 DIAGNOSIS — I10 Essential (primary) hypertension: Secondary | ICD-10-CM

## 2023-01-08 NOTE — Patient Instructions (Signed)
Medication Instructions:  Your physician recommends that you continue on your current medications as directed. Please refer to the Current Medication list given to you today.  *If you need a refill on your cardiac medications before your next appointment, please call your pharmacy*  Lab Work: BMET, CBC--TODAY If you have labs (blood work) drawn today and your tests are completely normal, you will receive your results only by: MyChart Message (if you have MyChart) OR A paper copy in the mail If you have any lab test that is abnormal or we need to change your treatment, we will call you to review the results.  Follow-Up: At [facility], you and your health needs are our priority.  As part of our continuing mission to provide you with exceptional heart care, we have created designated Provider Care Teams.  These Care Teams include your primary Cardiologist (physician) and Advanced Practice Providers (APPs -  Physician Assistants and Nurse Practitioners) who all work together to provide you with the care you need, when you need it.  We recommend signing up for the patient portal called "MyChart".  Sign up information is provided on this After Visit Summary.  MyChart is used to connect with patients for Virtual Visits (Telemedicine).  Patients are able to view lab/test results, encounter notes, upcoming appointments, etc.  Non-urgent messages can be sent to your provider as well.   To learn more about what you can do with MyChart, go to [email or web address].    Your next appointment:   6 month(s)  Provider:   Steffanie Dunn, MD

## 2023-01-09 LAB — BASIC METABOLIC PANEL
BUN/Creatinine Ratio: 31 — ABNORMAL HIGH (ref 12–28)
BUN: 27 mg/dL (ref 8–27)
CO2: 23 mmol/L (ref 20–29)
Calcium: 9.2 mg/dL (ref 8.7–10.3)
Chloride: 106 mmol/L (ref 96–106)
Creatinine, Ser: 0.88 mg/dL (ref 0.57–1.00)
Glucose: 118 mg/dL — ABNORMAL HIGH (ref 70–99)
Potassium: 4.2 mmol/L (ref 3.5–5.2)
Sodium: 143 mmol/L (ref 134–144)
eGFR: 63 mL/min/{1.73_m2} (ref >59)

## 2023-01-09 LAB — CBC
Hematocrit: 37.1 % (ref 34.0–46.6)
Hemoglobin: 11.7 g/dL (ref 11.1–15.9)
MCH: 26.8 pg (ref 26.6–33.0)
MCHC: 31.5 g/dL (ref 31.5–35.7)
MCV: 85 fL (ref 79–97)
Platelets: 217 10*3/uL (ref 150–450)
RBC: 4.37 x10E6/uL (ref 3.77–5.28)
RDW: 14.1 % (ref 11.7–15.4)
WBC: 8.1 10*3/uL (ref 3.4–10.8)

## 2023-01-15 ENCOUNTER — Telehealth: Payer: Self-pay | Admitting: *Deleted

## 2023-01-15 NOTE — Telephone Encounter (Signed)
   Pre-operative Risk Assessment    Patient Name: Kristin Hancock  DOB: Oct 01, 1930 MRN: [id number]     Request for Surgical Clearance    Procedure:  SURGICAL EXTRACTIONS NEEDED (WILL NEED TO CLARIFY HOW MANY TEETH), DEEP CLEANING   Date of Surgery:  Clearance TBD                                 Surgeon:  Lanier Prude, DMD Surgeon's Group or Practice Name:  [facility] Phone number:  [phone] Fax number:  [phone]   Type of Clearance Requested:   - Medical  - Pharmacy:  Hold Apixaban (Eliquis)     Type of Anesthesia:  Local ; MARCAINE   Additional requests/questions:    Elpidio Anis   01/15/2023, 5:35 PM

## 2023-01-17 NOTE — Telephone Encounter (Signed)
Patient with diagnosis of afib on Eliquis for anticoagulation.    Procedure: dental work-3 surgical extractions, deep cleaning, 1 crown prep and 1 filling.  Date of procedure: TBD   CHA2DS2-VASc Score = 4   This indicates a 4.8% annual risk of stroke. The patient's score is based upon: CHF History: 0 HTN History: 1 Diabetes History: 0 Stroke History: 0 Vascular Disease History: 0 Age Score: 2 Gender Score: 1    CrCl 41 mL/min (Sr Cr 0.88 01/08/2023) Platelet count 217 K (01/08/2023)  Patient does not require pre-op antibiotics for dental procedure.  Per office protocol, patient can hold Eliquis for 2 days prior to procedure.     **This guidance is not considered finalized until pre-operative APP has relayed final recommendations.**

## 2023-01-17 NOTE — Telephone Encounter (Signed)
Spoke with Kristin Hancock at [facility], she confirms that the pt is having a total of 3 surgical extractions, deep cleaning, 1 crown prep and 1 filling.

## 2023-01-18 NOTE — Telephone Encounter (Signed)
   Patient Name: Kristin Hancock  DOB: August 30, 1931 MRN: [id number]  Primary Cardiologist: None  Chart reviewed as part of pre-operative protocol coverage.  Patient to discuss surgical clearance request, recommendations for anticoagulation hold.  No answer.  Left voicemail for patient to return call at earliest convenience.   Joylene Grapes, NP 01/18/2023, 11:11 AM

## 2023-01-18 NOTE — Telephone Encounter (Signed)
   Name: Kristin Hancock  DOB: [birth year]  MRN: [id number]   Primary Cardiologist: None  Chart reviewed as part of pre-operative protocol coverage. Patient was contacted 01/18/2023 in reference to pre-operative risk assessment for pending surgery as outlined below.  Kristin Hancock was last seen on 01/08/2023 by Alejandro Mulling, PA.  Since that day, Kristin Hancock has done well from a cardiac standpoint. She denies any new symptoms or concerns.  She is able to complete greater than 4 METS without difficulty.   Therefore, based on ACC/AHA guidelines, the patient would be at acceptable risk for the planned procedure without further cardiovascular testing.   The patient was advised that if she develops new symptoms prior to surgery to contact our office to arrange for a follow-up visit, and she verbalized understanding.  Simple dental extractions (i.e. 1-2 teeth), cleanings, fillings, are considered low risk procedures per guidelines and generally do not require any specific cardiac clearance. It is also generally accepted that for simple extractions and dental cleanings, there is no need to interrupt blood thinner therapy.  If the pt is having more than two teeth extracted at once, per office protocol, patient can hold Eliquis for 2 days prior to procedure.  Please resume Eliquis as soon as possible postprocedure, at the discretion of the surgeon.   Patient does not require pre-op antibiotics for dental procedure.  I will route this recommendation to the requesting party via Epic fax function and remove from pre-op pool. Please call with questions.  Joylene Grapes, NP 01/18/2023, 11:22 AM

## 2023-01-22 DIAGNOSIS — H353213 Exudative age-related macular degeneration, right eye, with inactive scar: Secondary | ICD-10-CM | POA: Diagnosis not present

## 2023-01-22 DIAGNOSIS — H353121 Nonexudative age-related macular degeneration, left eye, early dry stage: Secondary | ICD-10-CM | POA: Diagnosis not present

## 2023-02-08 DIAGNOSIS — H918X3 Other specified hearing loss, bilateral: Secondary | ICD-10-CM | POA: Diagnosis not present

## 2023-02-08 DIAGNOSIS — Z01118 Encounter for examination of ears and hearing with other abnormal findings: Secondary | ICD-10-CM | POA: Diagnosis not present

## 2023-02-08 DIAGNOSIS — R2689 Other abnormalities of gait and mobility: Secondary | ICD-10-CM | POA: Diagnosis not present

## 2023-02-08 DIAGNOSIS — R94128 Abnormal results of other function studies of ear and other special senses: Secondary | ICD-10-CM | POA: Diagnosis not present

## 2023-02-08 DIAGNOSIS — M25562 Pain in left knee: Secondary | ICD-10-CM | POA: Diagnosis not present

## 2023-02-09 ENCOUNTER — Other Ambulatory Visit: Payer: Self-pay | Admitting: Internal Medicine

## 2023-02-09 DIAGNOSIS — Z0110 Encounter for examination of ears and hearing without abnormal findings: Secondary | ICD-10-CM

## 2023-03-06 DIAGNOSIS — H353133 Nonexudative age-related macular degeneration, bilateral, advanced atrophic without subfoveal involvement: Secondary | ICD-10-CM | POA: Diagnosis not present

## 2023-03-06 DIAGNOSIS — H35371 Puckering of macula, right eye: Secondary | ICD-10-CM | POA: Diagnosis not present

## 2023-03-06 DIAGNOSIS — H43813 Vitreous degeneration, bilateral: Secondary | ICD-10-CM | POA: Diagnosis not present

## 2023-03-08 DIAGNOSIS — M545 Low back pain, unspecified: Secondary | ICD-10-CM | POA: Diagnosis not present

## 2023-03-08 DIAGNOSIS — M1712 Unilateral primary osteoarthritis, left knee: Secondary | ICD-10-CM | POA: Diagnosis not present

## 2023-03-12 ENCOUNTER — Ambulatory Visit
Admission: RE | Admit: 2023-03-12 | Discharge: 2023-03-12 | Disposition: A | Discharge status: Home / Self Care | Payer: Medicare Other | Source: Ambulatory Visit | Attending: Internal Medicine | Admitting: Internal Medicine

## 2023-03-12 DIAGNOSIS — Z0110 Encounter for examination of ears and hearing without abnormal findings: Secondary | ICD-10-CM

## 2023-03-12 DIAGNOSIS — H9192 Unspecified hearing loss, left ear: Secondary | ICD-10-CM | POA: Diagnosis not present

## 2023-03-12 MED ORDER — GADOPICLENOL 0.5 MMOL/ML IV SOLN
8.0000 mL | Freq: Once | INTRAVENOUS | Status: AC | PRN
  Administered 2023-03-12: 8 mL via INTRAVENOUS

## 2023-03-24 ENCOUNTER — Other Ambulatory Visit: Payer: Medicare Other

## 2023-05-08 DIAGNOSIS — H43813 Vitreous degeneration, bilateral: Secondary | ICD-10-CM | POA: Diagnosis not present

## 2023-05-08 DIAGNOSIS — H353133 Nonexudative age-related macular degeneration, bilateral, advanced atrophic without subfoveal involvement: Secondary | ICD-10-CM | POA: Diagnosis not present

## 2023-05-08 DIAGNOSIS — H35371 Puckering of macula, right eye: Secondary | ICD-10-CM | POA: Diagnosis not present

## 2023-05-15 DIAGNOSIS — I4819 Other persistent atrial fibrillation: Secondary | ICD-10-CM | POA: Diagnosis not present

## 2023-05-15 DIAGNOSIS — M5136 Other intervertebral disc degeneration, lumbar region: Secondary | ICD-10-CM | POA: Diagnosis not present

## 2023-05-15 DIAGNOSIS — H3530 Unspecified macular degeneration: Secondary | ICD-10-CM | POA: Diagnosis not present

## 2023-05-15 DIAGNOSIS — I10 Essential (primary) hypertension: Secondary | ICD-10-CM | POA: Diagnosis not present

## 2023-05-15 DIAGNOSIS — E7800 Pure hypercholesterolemia, unspecified: Secondary | ICD-10-CM | POA: Diagnosis not present

## 2023-05-15 DIAGNOSIS — M1712 Unilateral primary osteoarthritis, left knee: Secondary | ICD-10-CM | POA: Diagnosis not present

## 2023-05-15 DIAGNOSIS — H918X3 Other specified hearing loss, bilateral: Secondary | ICD-10-CM | POA: Diagnosis not present

## 2023-05-15 DIAGNOSIS — Z9989 Dependence on other enabling machines and devices: Secondary | ICD-10-CM | POA: Diagnosis not present

## 2023-06-21 ENCOUNTER — Other Ambulatory Visit: Payer: Self-pay | Admitting: Cardiology

## 2023-06-29 ENCOUNTER — Ambulatory Visit: Payer: Medicare Other | Attending: Cardiology | Admitting: Cardiology

## 2023-06-29 ENCOUNTER — Encounter: Payer: Self-pay | Admitting: Cardiology

## 2023-06-29 VITALS — BP 144/60 | HR 70 | Ht 61.5 in | Wt 171.4 lb

## 2023-06-29 DIAGNOSIS — I10 Essential (primary) hypertension: Secondary | ICD-10-CM

## 2023-06-29 DIAGNOSIS — I4819 Other persistent atrial fibrillation: Secondary | ICD-10-CM

## 2023-06-29 DIAGNOSIS — Z79899 Other long term (current) drug therapy: Secondary | ICD-10-CM

## 2023-06-29 IMAGING — DX DG KNEE COMPLETE 4+V*R*
5 series · 5 of 5 positions shown · non-contrast
Comparison: None.

CLINICAL DATA: Right knee pain and swelling. Fall onto patella 1
week ago. Improved pain with persistent swelling and bruising.

EXAM:
RIGHT KNEE - COMPLETE 4+ VIEW

[dg knee complete 4 views right (1 of 5)]
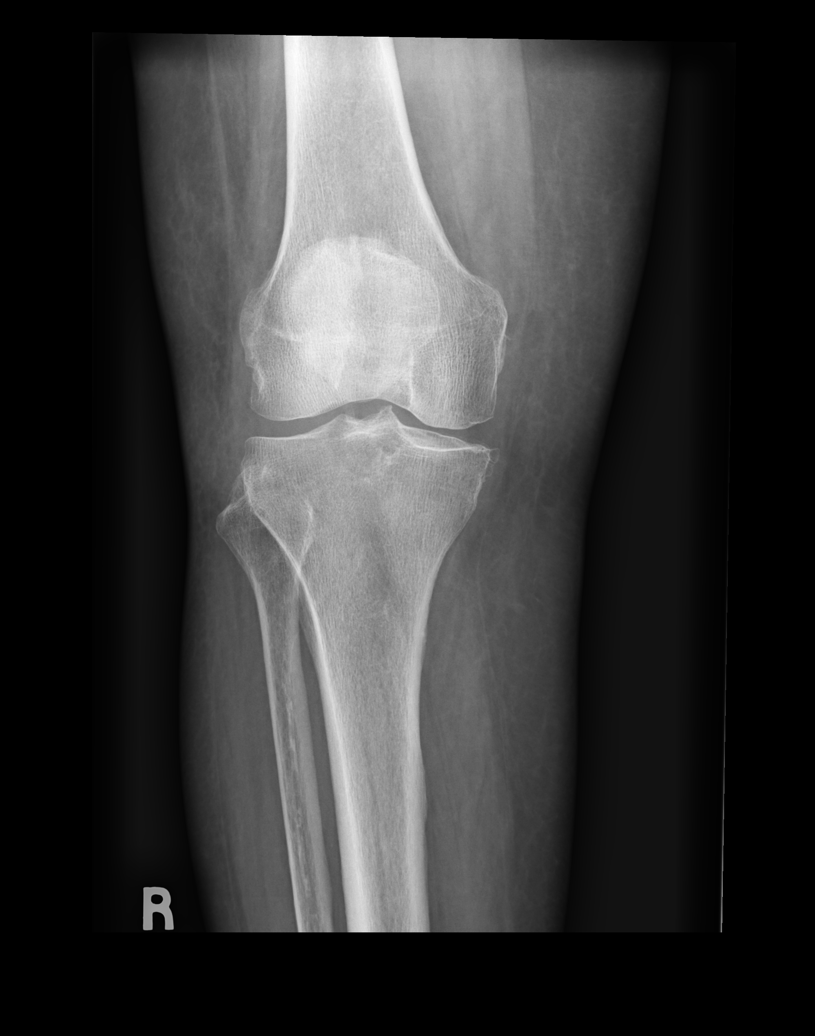

[dg knee complete 4 views right (2 of 5)]
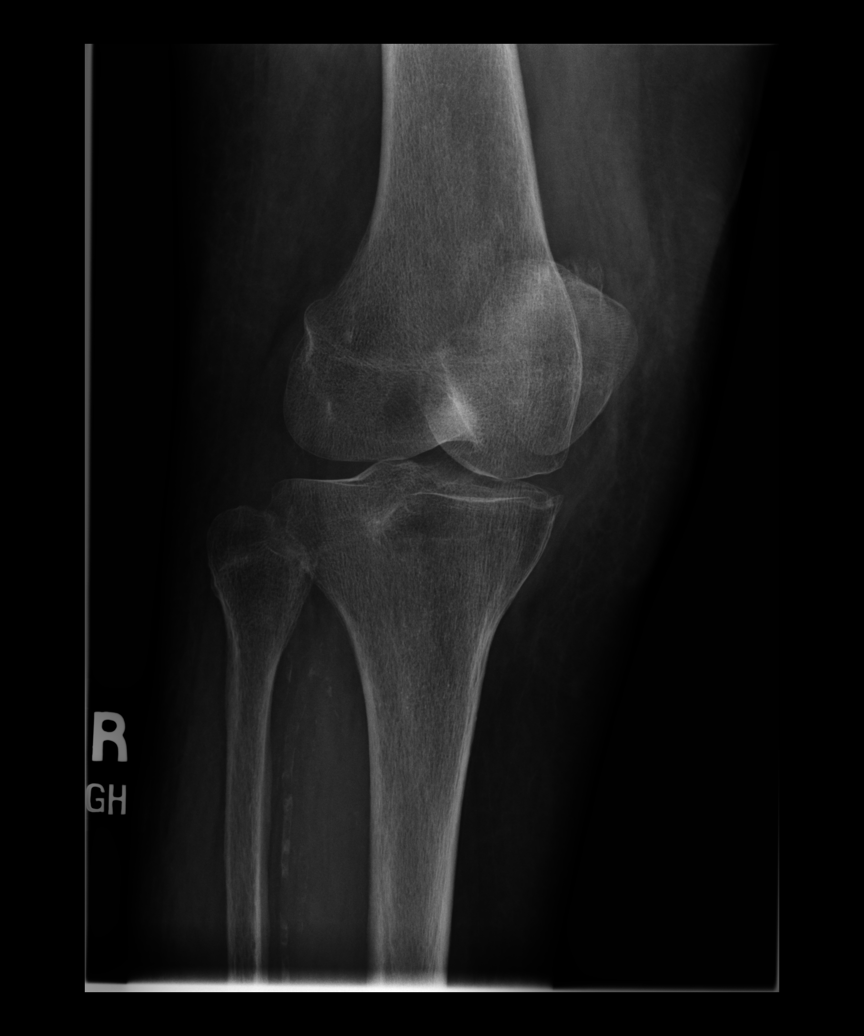

[dg knee complete 4 views right (3 of 5)]
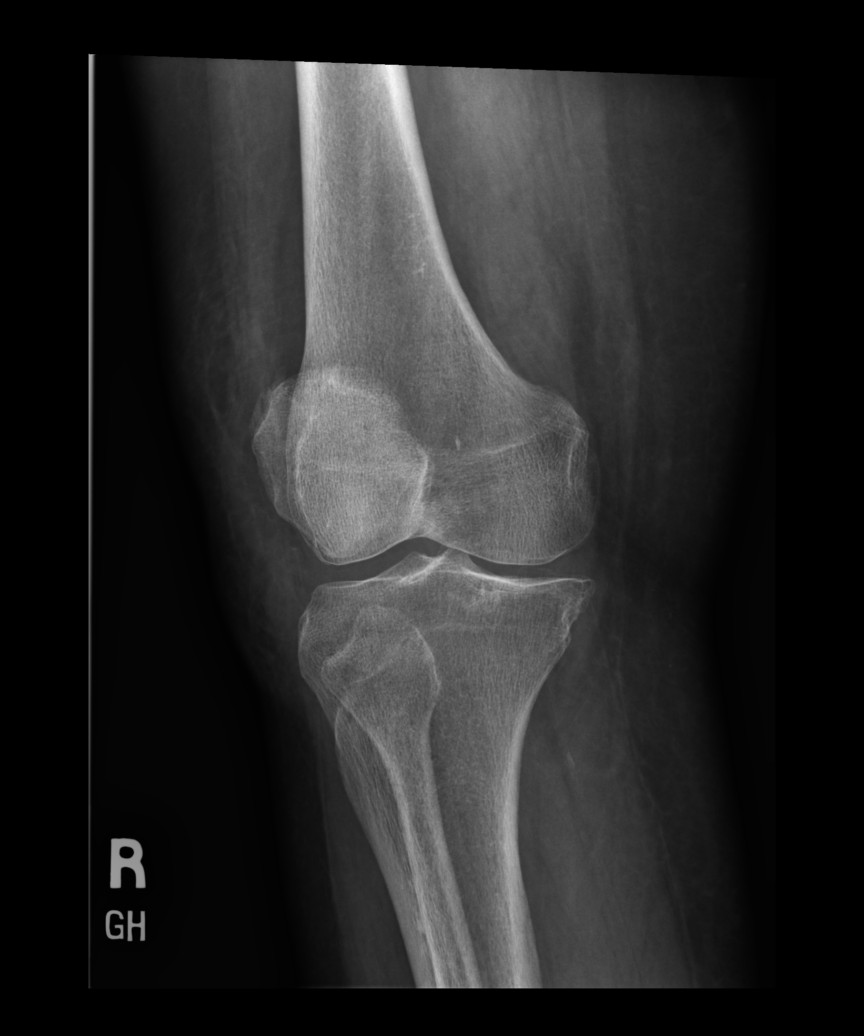

[dg knee complete 4 views right (4 of 5)]
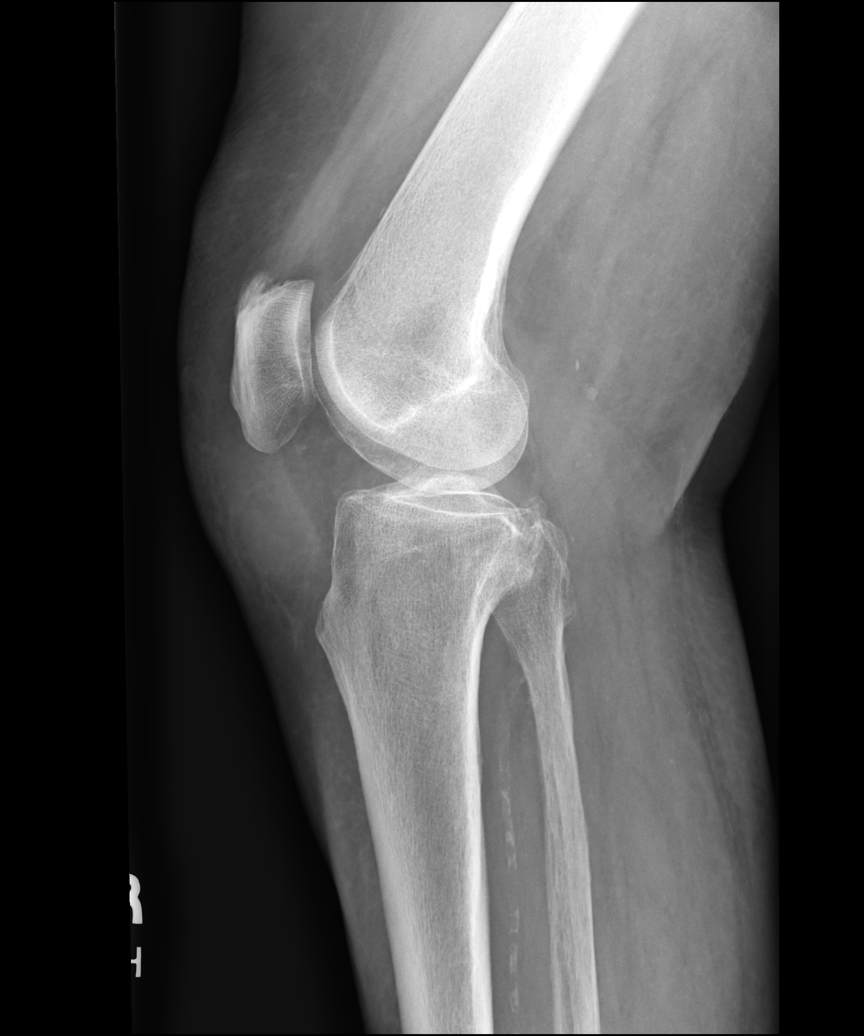

[dg knee complete 4 views right (5 of 5)]
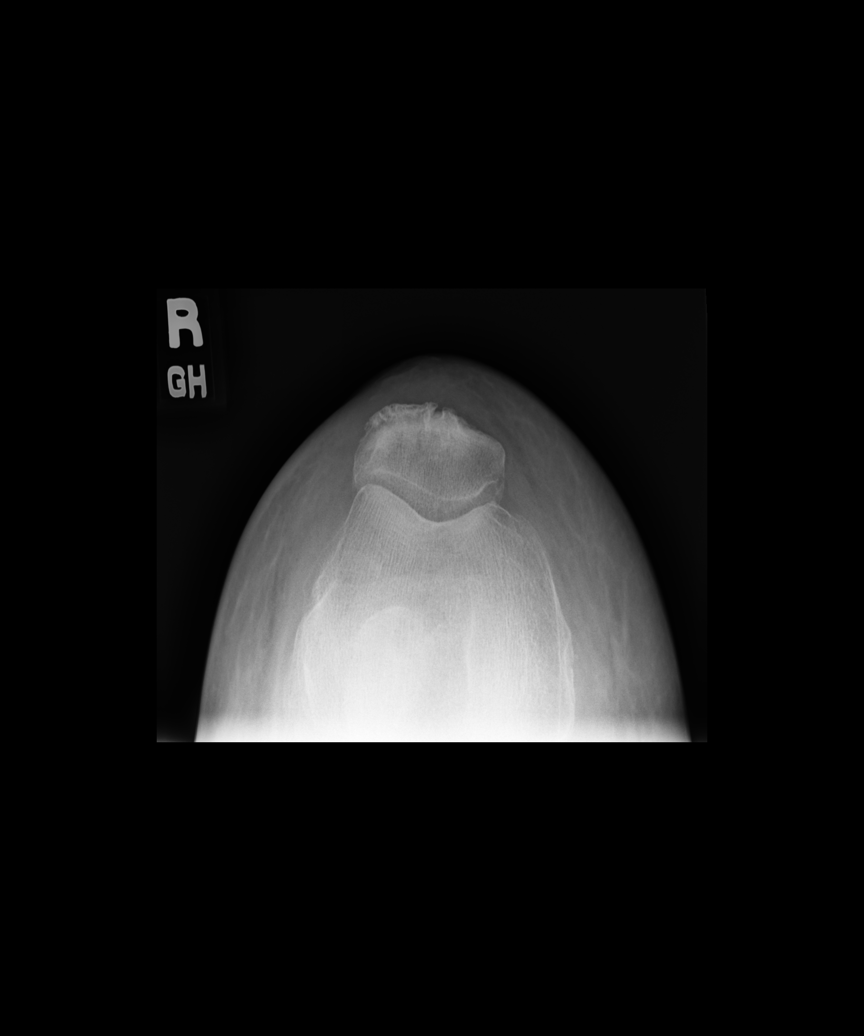

[5 of 5 positions shown; findings below may reference images not displayed]

FINDINGS: No fracture or dislocation. Joint spaces are preserved. There is
mild patellofemoral and tibiofemoral joint space narrowing. Mild
spurring of the tibial spines. Small quadriceps tendon enthesophyte.
There is a trace joint effusion. Moderate prepatellar soft tissue
edema. No soft tissue air or radiopaque foreign bodies. Vascular
calcifications are seen.
IMPRESSION: 1. No acute fracture or dislocation.
2. Moderate prepatellar soft tissue edema. Trace joint effusion.
3. Mild for age osteoarthritis.

## 2023-06-29 MED ORDER — FLECAINIDE ACETATE 50 MG PO TABS: 50.0000 mg | ORAL_TABLET | Freq: Two times a day (BID) | ORAL | 3 refills | Status: DC

## 2023-06-29 NOTE — Patient Instructions (Addendum)
Medication Instructions:  Your physician has recommended you make the following change in your medication:  1) DECREASE flecainide 50 mg twice daily  *If you need a refill on your cardiac medications before your next appointment, please call your pharmacy*  Follow-Up: At [facility], you and your health needs are our priority.  As part of our continuing mission to provide you with exceptional heart care, we have created designated Provider Care Teams.  These Care Teams include your primary Cardiologist (physician) and Advanced Practice Providers (APPs -  Physician Assistants and Nurse Practitioners) who all work together to provide you with the care you need, when you need it.  Your next appointment:   6 months  Provider:   You will see one of the following Advanced Practice Providers on your designated Care Team:   Kristin Hancock, Kristin Hancock [address] Kristin Hancock, [place] Kristin Don, NP Kristin Brim, NP

## 2023-06-29 NOTE — Progress Notes (Signed)
  Electrophysiology Office Follow up Visit Note:    Date:  06/29/2023   ID:  Kristin Hancock, DOB 11-16-1930, MRN [id number]  PCP:  Ollen Bowl, MD  [facility] Cardiologist:  None  [facility] Electrophysiologist:  Lanier Prude, MD    Interval History:    Kristin Hancock is a 89 y.o. female who presents for a follow up visit.   I last saw the patient in October 12, 2021 for her persistent atrial fibrillation.  She takes flecainide and Eliquis. She is doing well.  She has not had an episode of atrial fibrillation.  She has a difficult time splitting her flecainide.      Past medical, surgical, social and family history were reviewed.  ROS:   Please see the history of present illness.    All other systems reviewed and are negative.  EKGs/Labs/Other Studies Reviewed:    The following studies were reviewed today:    EKG Interpretation Date/Time:  Friday June 29 2023 15:49:20 EDT Ventricular Rate:  71 PR Interval:  190 QRS Duration:  98 QT Interval:  416 QTC Calculation: 452 R Axis:   37  Text Interpretation: Normal sinus rhythm Low voltage QRS Confirmed by Steffanie Dunn [phone]) on 06/29/2023 4:08:11 PM    Physical Exam:    VS:  BP (!) 144/60   Pulse 70   Ht 5' 1.5" (1.562 m)   Wt 171 lb 6.4 oz (77.7 kg)   LMP  (LMP Unknown)   SpO2 94%   BMI 31.86 kg/m     Wt Readings from Last 3 Encounters:  06/29/23 171 lb 6.4 oz (77.7 kg)  01/08/23 175 lb (79.4 kg)  07/07/22 172 lb (78 kg)     GEN:  Well nourished, well developed in no acute distress CARDIAC: RRR, no murmurs, rubs, gallops RESPIRATORY:  Clear to auscultation without rales, wheezing or rhonchi       ASSESSMENT:    1. Persistent atrial fibrillation (HCC)   2. Primary hypertension   3. Encounter for long-term (current) use of high-risk medication    PLAN:    In order of problems listed above:  #Persistent atrial fibrillation #High risk med  monitoring-flecainide Maintaining sinus rhythm Continue flecainide, reduce dose to 50 mg by mouth twice daily PR and QRS durations acceptable for continued flecainide use  Continue Eliquis for stroke prophylaxis  #Hypertension Slightly above goal today but we will keep her regimen the same.  Recommend checking blood pressures 1-2 times per week at home and recording the values.  Recommend bringing these recordings to the primary care physician. Continue amlodipine, hydrochlorothiazide, losartan.  Follow-up 6 months with APP    Signed, Steffanie Dunn, MD, [facility], [facility] 06/29/2023 4:16 PM    Electrophysiology [facility]

## 2023-07-10 DIAGNOSIS — H353133 Nonexudative age-related macular degeneration, bilateral, advanced atrophic without subfoveal involvement: Secondary | ICD-10-CM | POA: Diagnosis not present

## 2023-07-10 DIAGNOSIS — H43813 Vitreous degeneration, bilateral: Secondary | ICD-10-CM | POA: Diagnosis not present

## 2023-07-10 DIAGNOSIS — H35371 Puckering of macula, right eye: Secondary | ICD-10-CM | POA: Diagnosis not present

## 2023-08-14 ENCOUNTER — Other Ambulatory Visit: Payer: Self-pay | Admitting: Cardiology

## 2023-09-11 ENCOUNTER — Ambulatory Visit: Payer: Medicare Other | Admitting: Cardiology

## 2023-09-11 DIAGNOSIS — H353133 Nonexudative age-related macular degeneration, bilateral, advanced atrophic without subfoveal involvement: Secondary | ICD-10-CM | POA: Diagnosis not present

## 2023-09-11 DIAGNOSIS — H35371 Puckering of macula, right eye: Secondary | ICD-10-CM | POA: Diagnosis not present

## 2023-09-11 DIAGNOSIS — H43813 Vitreous degeneration, bilateral: Secondary | ICD-10-CM | POA: Diagnosis not present

## 2023-10-21 ENCOUNTER — Other Ambulatory Visit: Payer: Self-pay | Admitting: Cardiology

## 2023-10-21 DIAGNOSIS — I4819 Other persistent atrial fibrillation: Secondary | ICD-10-CM

## 2023-10-22 NOTE — Telephone Encounter (Signed)
Prescription refill request for Eliquis received. Indication:afi Last office visit:10/24 Scr:0.88  4/24 Age: 89 Weight:77.7  kg  Prescription refilled

## 2023-12-19 ENCOUNTER — Other Ambulatory Visit: Payer: Self-pay | Admitting: Cardiology

## 2023-12-19 MED ORDER — AMLODIPINE BESYLATE 5 MG PO TABS: 5.0000 mg | ORAL_TABLET | Freq: Every day | ORAL | 3 refills | Status: DC

## 2023-12-19 MED ORDER — LOSARTAN POTASSIUM 100 MG PO TABS: 100.0000 mg | ORAL_TABLET | Freq: Every day | ORAL | 3 refills | Status: DC

## 2023-12-19 MED ORDER — ATENOLOL 50 MG PO TABS: 50.0000 mg | ORAL_TABLET | Freq: Every day | ORAL | 3 refills | Status: DC

## 2023-12-23 NOTE — Progress Notes (Unsigned)
  Cardiology Office Note:  .   Date:  12/23/2023  ID:  Kristin Hancock, DOB [birth year], MRN [id number] PCP: Ollen Bowl, MD  [facility] Providers Cardiologist:  None Electrophysiologist:  Lanier Prude, MD {  History of Present Illness: .   Kristin Hancock is a 89 y.o. female w/PMHx of  HTN, HLD AFib  Saw Dr. Lalla Brothers 06/29/23, doing well, no Afib, cutting flecainide (for75mg  dose) was hard >> dose reduced to 50mg  BID Planned for 6 mo APP visit   Today's visit is scheduled as a 6 mo visit  ROS:   She reports DOE Walking room to room even in her house gets her winded She is fairly sedentary perhaps primarily 2/2 her b/l knee and back arthritis DOE is not necessarily new or changing but worries her No rest SOB No nocturnal/PND symptoms No CP She doesn't think she ha had AFib No bleeding or signs of bleeding No near syncope or syncope   Arrhythmia/AAD hx Afib goes back at least to 2021 (likely prior to that having been treated previously in [place] > [place] in March 2022 already on flecainide) Flecainide > dose increase march 2022 >> reduced back to 50mg  BID Oct 2024 2/2 difficulty cutting tabs>>> remains current  Studies Reviewed: Marland Kitchen    EKG done today and reviewed by myself:  SR 70bpm, low voltage, poor R progression PR , QRS 86ms, QTc No significant changes noted  2016 scanned echo with normal LVEF  Risk Assessment/Calculations:    Physical Exam:   VS:  LMP  (LMP Unknown)    Wt Readings from Last 3 Encounters:  06/29/23 171 lb 6.4 oz (77.7 kg)  01/08/23 175 lb (79.4 kg)  07/07/22 172 lb (78 kg)    GEN: Well nourished, well developed in no acute distress NECK: No JVD; No carotid bruits CARDIAC: RRR, no murmurs, rubs, gallops RESPIRATORY:  CTA b/l without rales, wheezing or rhonchi  ABDOMEN: Soft, non-tender, non-distended EXTREMITIES:  No edema; No deformity   ASSESSMENT AND PLAN: .    paroxysmal AFib CHA2DS2Vasc is 4, on  Eliquis,  appropriately dosed Flecainide w/atenolol, stable intervals no burden by symptoms  HTN Better at home  Secondary hypercoagulable state 2/2 AFib  4. DOE Will update her echo No exam findings of volume OL No murmur on exam      Dispo: back in 6 mo again otherwise, sooner if needed  Signed, Sheilah Pigeon, PA-C

## 2023-12-26 ENCOUNTER — Encounter: Payer: Self-pay | Admitting: Physician Assistant

## 2023-12-26 ENCOUNTER — Ambulatory Visit: Payer: Medicare Other | Attending: Physician Assistant | Admitting: Physician Assistant

## 2023-12-26 VITALS — BP 154/56 | HR 70 | Ht 62.0 in | Wt 165.4 lb

## 2023-12-26 DIAGNOSIS — Z79899 Other long term (current) drug therapy: Secondary | ICD-10-CM

## 2023-12-26 DIAGNOSIS — I10 Essential (primary) hypertension: Secondary | ICD-10-CM

## 2023-12-26 DIAGNOSIS — I4819 Other persistent atrial fibrillation: Secondary | ICD-10-CM | POA: Diagnosis not present

## 2023-12-26 DIAGNOSIS — Z5181 Encounter for therapeutic drug level monitoring: Secondary | ICD-10-CM | POA: Diagnosis not present

## 2023-12-26 DIAGNOSIS — R0609 Other forms of dyspnea: Secondary | ICD-10-CM | POA: Diagnosis not present

## 2023-12-26 NOTE — Patient Instructions (Signed)
 Medication Instructions:   Your physician recommends that you continue on your current medications as directed. Please refer to the Current Medication list given to you today.  *If you need a refill on your cardiac medications before your next appointment, please call your pharmacy*  Lab Work:  PLEASE GO DOWN STAIRS  [facility]  FIRST FLOOR  [address] ( GET OFF ELEVATORS MAKE A LEFT AND ANOTHER LEFT LAB ON RIGHT DOWN HALLWAY :  BMET AND CBC TODAY    If you have labs (blood work) drawn today and your tests are completely normal, you will receive your results only by: MyChart Message (if you have MyChart) OR A paper copy in the mail If you have any lab test that is abnormal or we need to change your treatment, we will call you to review the results.  Testing/Procedures: Your physician has requested that you have an echocardiogram. Echocardiography is a painless test that uses sound waves to create images of your heart. It provides your doctor with information about the size and shape of your heart and how well your heart's chambers and valves are working. This procedure takes approximately one hour. There are no restrictions for this procedure. Please do NOT wear cologne, perfume, aftershave, or lotions (deodorant is allowed). Please arrive 15 minutes prior to your appointment time.  Please note: We ask at that you not bring children with you during ultrasound (echo/ vascular) testing. Due to room size and safety concerns, children are not allowed in the ultrasound rooms during exams. Our front office staff cannot provide observation of children in our lobby area while testing is being conducted. An adult accompanying a patient to their appointment will only be allowed in the ultrasound room at the discretion of the ultrasound technician under special circumstances. We apologize for any inconvenience.    Follow-Up: At [facility], you and your health needs are our priority.  As part of  our continuing mission to provide you with exceptional heart care, our providers are all part of one team.  This team includes your primary Cardiologist (physician) and Advanced Practice Providers or APPs (Physician Assistants and Nurse Practitioners) who all work together to provide you with the care you need, when you need it.  Your next appointment:   6 month(s)  Provider:   Steffanie Dunn, MD or Francis Dowse, PA-C    We recommend signing up for the patient portal called "MyChart".  Sign up information is provided on this After Visit Summary.  MyChart is used to connect with patients for Virtual Visits (Telemedicine).  Patients are able to view lab/test results, encounter notes, upcoming appointments, etc.  Non-urgent messages can be sent to your provider as well.   To learn more about what you can do with MyChart, go to [email or web address].   Other Instructions       1st Floor: - Lobby - Registration  - Pharmacy  - Lab - Cafe  2nd Floor: - PV Lab - Diagnostic Testing (echo, CT, nuclear med)  3rd Floor: - Vacant  4th Floor: - TCTS (cardiothoracic surgery) - AFib Clinic - Structural Heart Clinic - Vascular Surgery  - Vascular Ultrasound  5th Floor: - HeartCare Cardiology (general and EP) - Clinical Pharmacy for coumadin, hypertension, lipid, weight-loss medications, and med management appointments    Valet parking services will be available as well.

## 2023-12-27 LAB — CBC
Hematocrit: 39 % (ref 34.0–46.6)
Hemoglobin: 12.1 g/dL (ref 11.1–15.9)
MCH: 26 pg — ABNORMAL LOW (ref 26.6–33.0)
MCHC: 31 g/dL — ABNORMAL LOW (ref 31.5–35.7)
MCV: 84 fL (ref 79–97)
Platelets: 250 10*3/uL (ref 150–450)
RBC: 4.65 x10E6/uL (ref 3.77–5.28)
RDW: 14.5 % (ref 11.7–15.4)
WBC: 7.4 10*3/uL (ref 3.4–10.8)

## 2023-12-27 LAB — BASIC METABOLIC PANEL WITH GFR
BUN/Creatinine Ratio: 24 (ref 12–28)
BUN: 20 mg/dL (ref 10–36)
CO2: 22 mmol/L (ref 20–29)
Calcium: 9.2 mg/dL (ref 8.7–10.3)
Chloride: 102 mmol/L (ref 96–106)
Creatinine, Ser: 0.84 mg/dL (ref 0.57–1.00)
Glucose: 122 mg/dL — ABNORMAL HIGH (ref 70–99)
Potassium: 4.5 mmol/L (ref 3.5–5.2)
Sodium: 142 mmol/L (ref 134–144)
eGFR: 66 mL/min/{1.73_m2} (ref >59)

## 2024-01-22 ENCOUNTER — Other Ambulatory Visit: Payer: Self-pay | Admitting: Cardiology

## 2024-01-29 ENCOUNTER — Ambulatory Visit (HOSPITAL_COMMUNITY): Attending: Physician Assistant

## 2024-01-29 DIAGNOSIS — R0609 Other forms of dyspnea: Secondary | ICD-10-CM | POA: Diagnosis present

## 2024-01-29 LAB — ECHOCARDIOGRAM COMPLETE
Area-P 1/2: 2.91 cm2
S' Lateral: 3 cm

## 2024-02-08 ENCOUNTER — Ambulatory Visit: Payer: Self-pay | Admitting: *Deleted

## 2024-02-08 ENCOUNTER — Other Ambulatory Visit: Payer: Self-pay | Admitting: *Deleted

## 2024-02-28 IMAGING — DX DG CHEST 2V
2 series · 2 of 2 positions shown · non-contrast
Comparison: None.

CLINICAL DATA: Shortness of breath x3 months.

EXAM:
CHEST - 2 VIEW

[dg chest 2 view (1 of 2)]
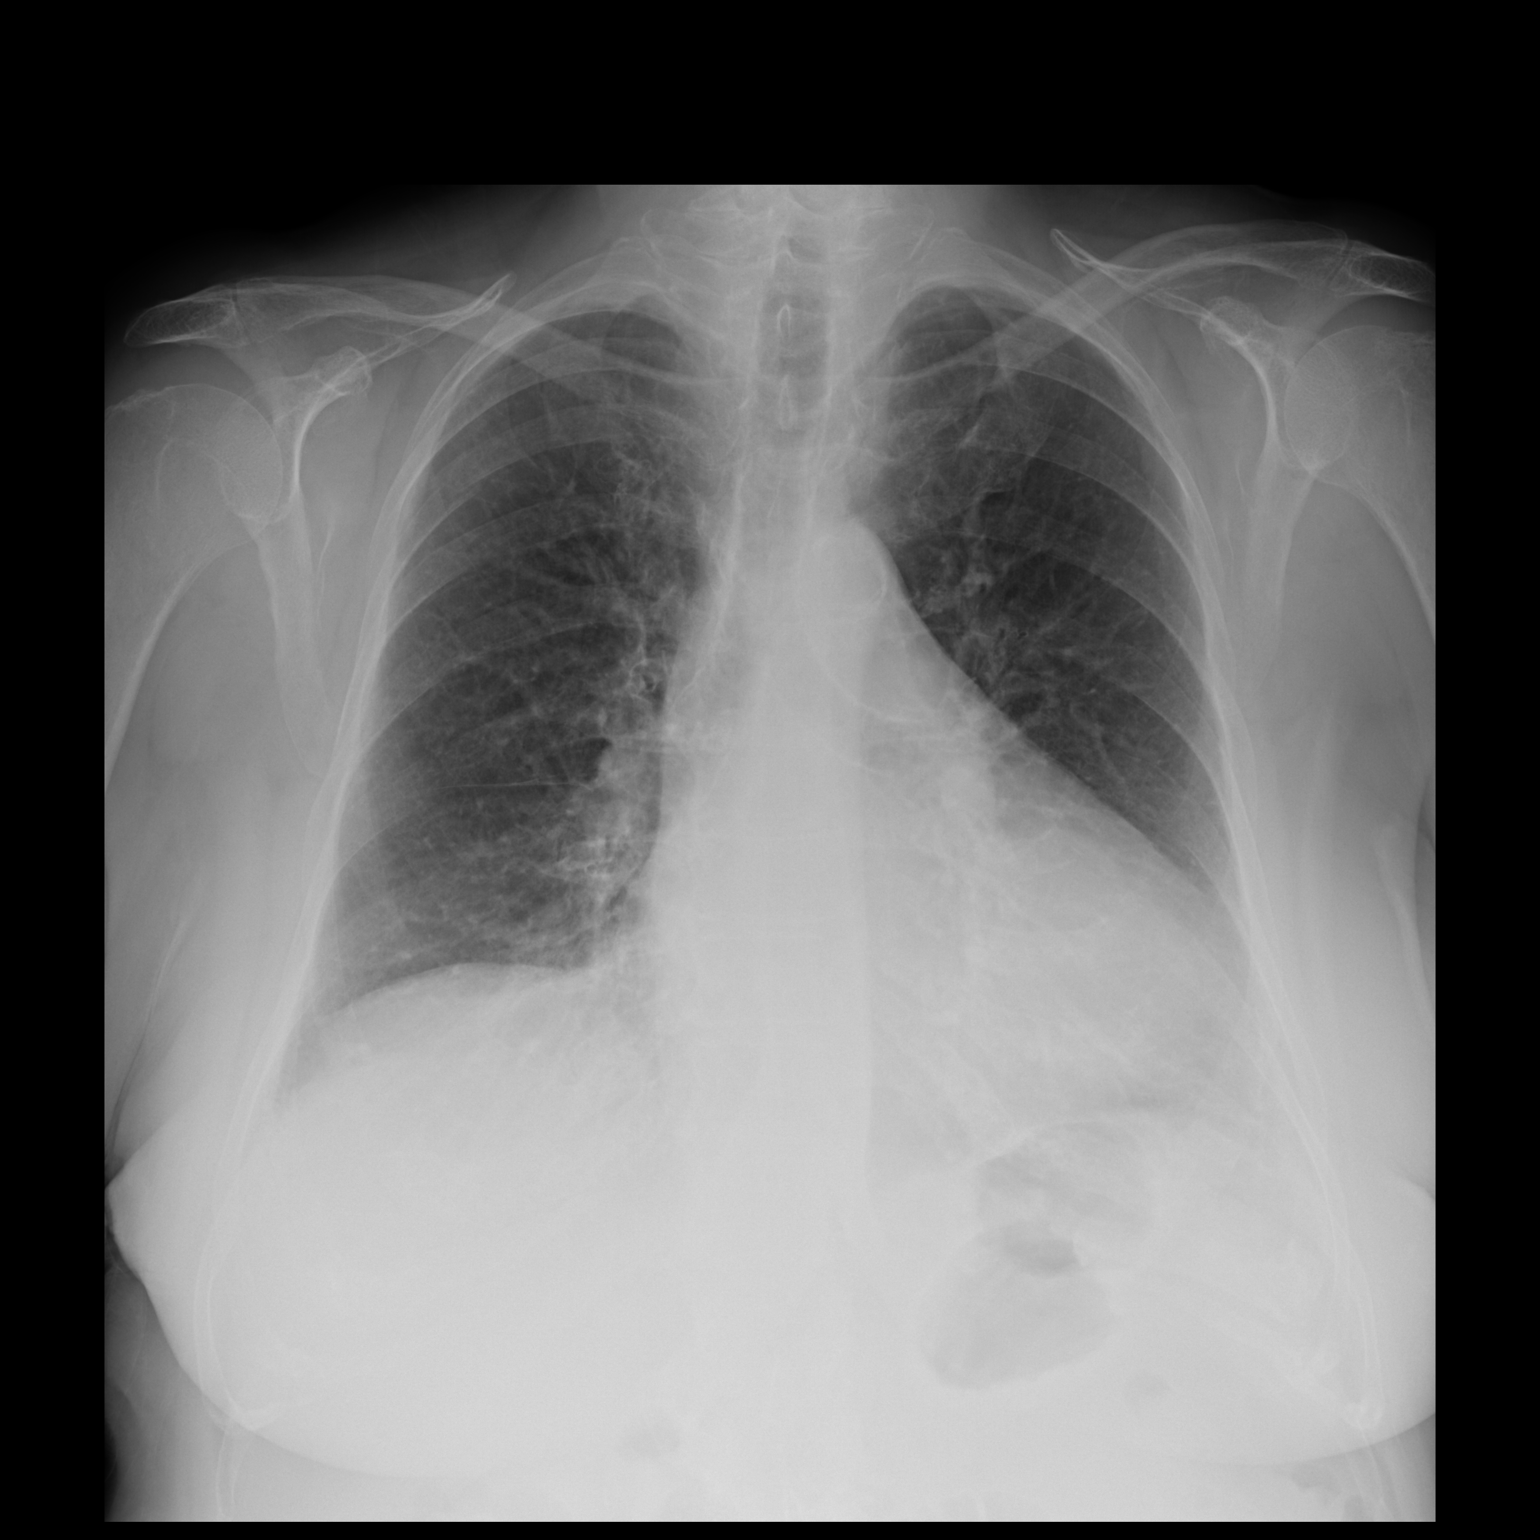

[dg chest 2 view (2 of 2)]
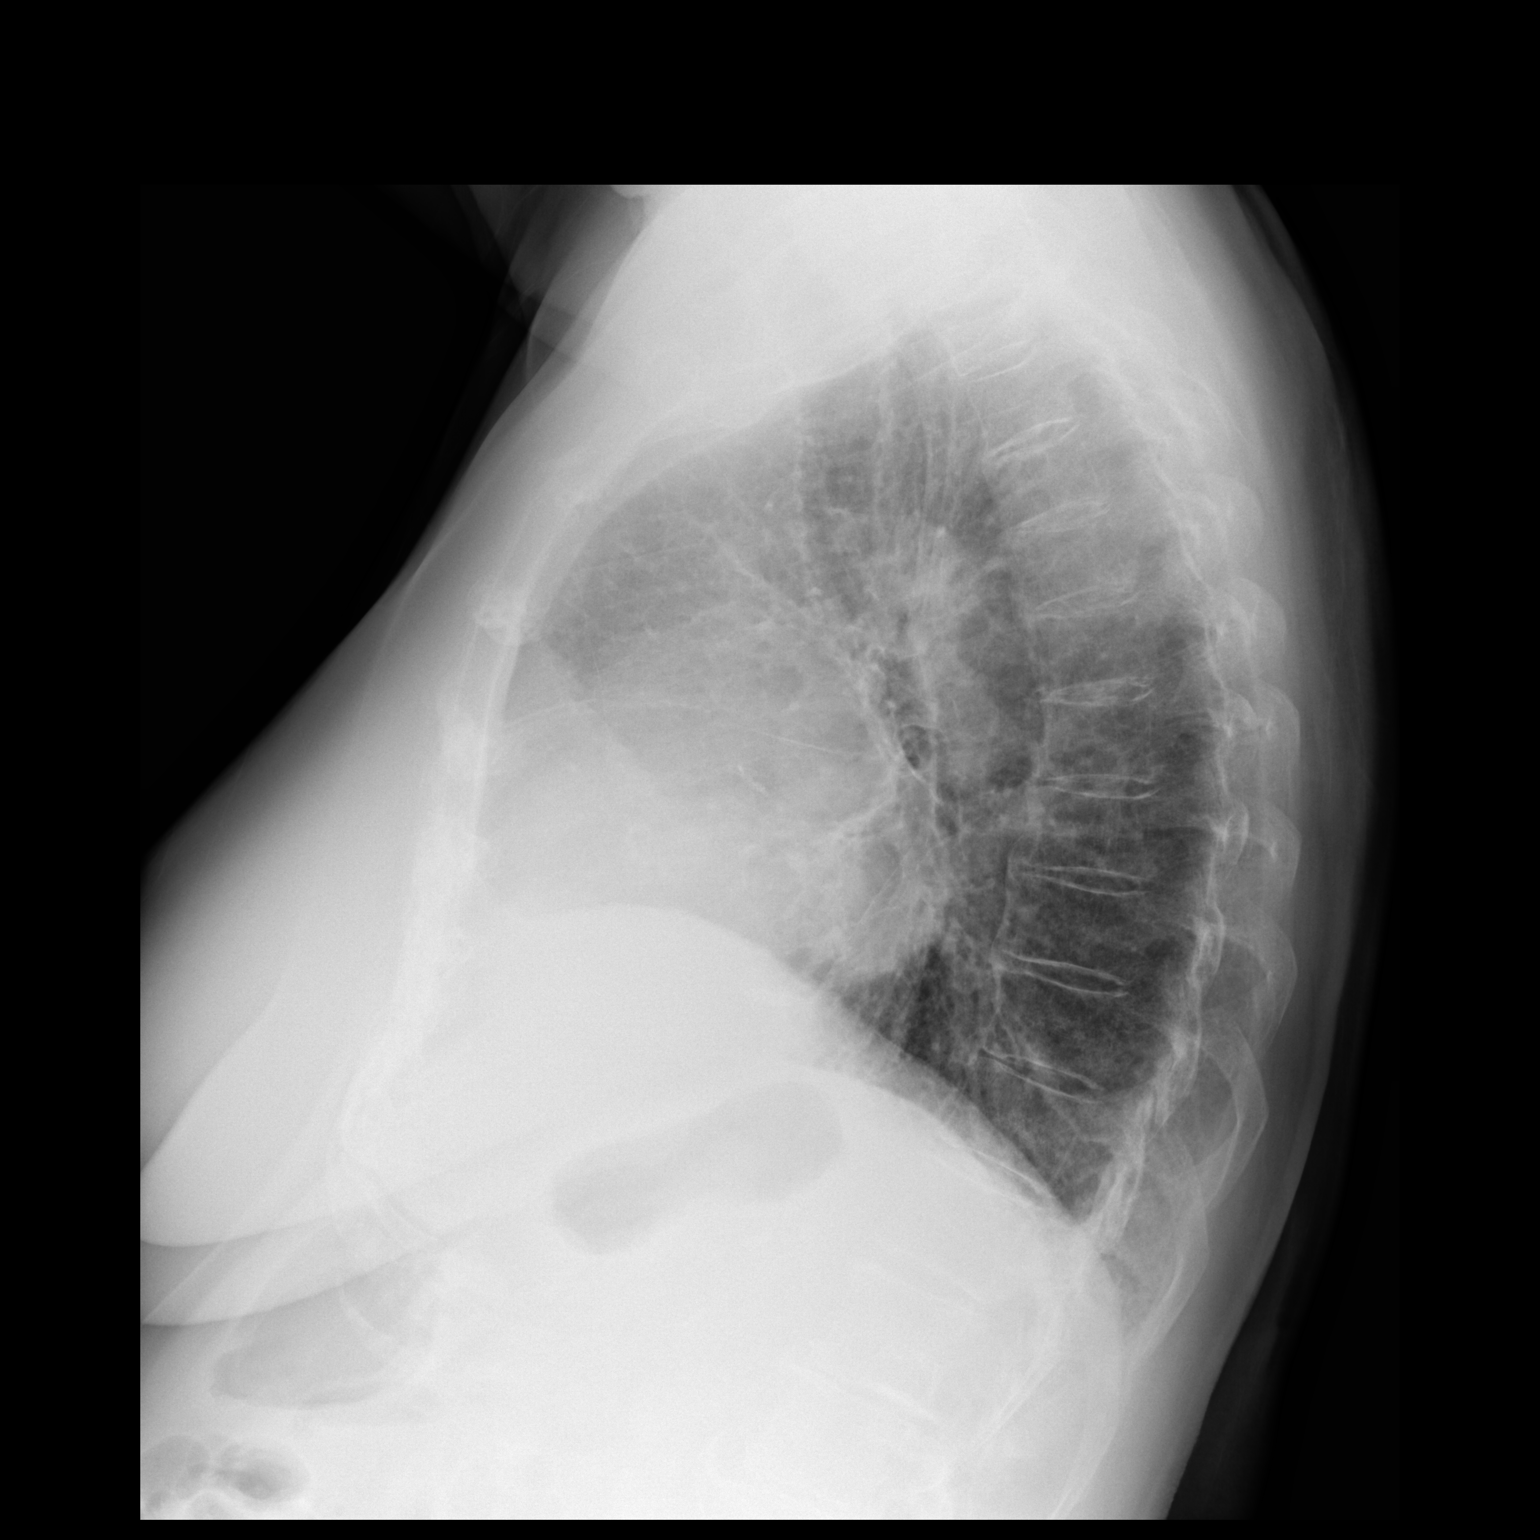

[2 of 2 positions shown; findings below may reference images not displayed]

FINDINGS: There is no evidence of acute infiltrate, pleural effusion or
pneumothorax. Mild breast soft tissue artifact is seen overlying the
bilateral lung bases. The cardiac silhouette is borderline in size.
Moderate severity calcification of the aortic arch is seen. The
visualized skeletal structures are unremarkable.
IMPRESSION: No active cardiopulmonary disease.

## 2024-03-02 NOTE — H&P (View-Only) (Signed)
 Cardiology Office Note:  .   Date:  03/02/2024  ID:  Kristin Hancock, DOB 1931/05/26, MRN [id number] PCP: Elester Grim, MD  [facility] Providers Cardiologist:  None Electrophysiologist:  Boyce Byes, MD {  History of Present Illness: .   Kristin Hancock is a 89 y.o. female w/PMHx of  HTN, HLD AFib  Saw Dr. Marven Slimmer 06/29/23, doing well, no Afib, cutting flecainide  (for75mg  dose) was hard >> dose reduced to 50mg  BID Planned for 6 mo APP visit  I saw her 12/26/23 She reports DOE Walking room to room even in her house gets her winded She is fairly sedentary perhaps primarily 2/2 her b/l knee and back arthritis DOE is not necessarily new or changing but worried her EKG w/stable intervals Did not appear volume OL, but planned for an echo   LVEF 45-50% Given this her flecainide  stopped (Dr. Marven Slimmer was in agreement) And recommended to come in to review medications    Today's visit is scheduled to follow up on echo and med changes ROS:   She is accompanied by her daughter today By checking her daily BP/vitals she has observed that her HR has been steadily in the 102's since about May 20th No cardiac awareness No CP Since then perhaps a little more winded, her daughter adds infrequently but mentioned some slight dizziness. No rest SOB or symptoms of PND/orthopnea No near syncope or syncope She reports excellent medication compliance, never misses her Eliquis  and is certain she has not missed any in 3 weeks (if ever)   Arrhythmia/AAD hx Afib goes back at least to 2021 (likely prior to that having been treated previously in [place] > [place] in March 2022 already on flecainide ) Flecainide  > dose increase march 2022 >> reduced back to 50mg  BID Oct 2024 2/2 difficulty cutting tabs>>> remains current Flecainide  stopped May 2025 2/2 abnormal echo/reduced LVEF  Studies Reviewed: Kristin Hancock    EKG done today and reviewed by myself AFlutter 2:1, 129bpm  SR 70bpm, low voltage,  poor R progression PR , QRS 86ms, QTc No significant changes noted  01/29/24: TTE 1. Left ventricular ejection fraction, by estimation, is 45 to 50%. The  left ventricle has mildly decreased function. The left ventricle  demonstrates global hypokinesis. Left ventricular diastolic function could  not be evaluated.   2. Right ventricular systolic function is mildly reduced. The right  ventricular size is normal. There is moderately elevated pulmonary artery  systolic pressure. The estimated right ventricular systolic pressure is  49.0 mmHg.   3. Left atrial size was mild to moderately dilated.   4. The mitral valve is degenerative. Trivial mitral valve regurgitation.  Mild mitral stenosis. The mean mitral valve gradient is 6.3 mmHg with  average heart rate of 99 bpm. Moderate mitral annular calcification.   5. Tricuspid valve regurgitation is mild to moderate.   6. The aortic valve is tricuspid. There is mild calcification of the  aortic valve. There is mild thickening of the aortic valve. Aortic valve  regurgitation is not visualized. Aortic valve sclerosis/calcification is  present, without any evidence of  aortic stenosis.   7. The inferior vena cava is normal in size with greater than 50%  respiratory variability, suggesting right atrial pressure of 3 mmHg.    2016 scanned echo with normal LVEF  Risk Assessment/Calculations:    Physical Exam:   VS:  LMP  (LMP Unknown)    Wt Readings from Last 3 Encounters:  12/26/23 165 lb 6.4 oz (75  kg)  06/29/23 171 lb 6.4 oz (77.7 kg)  01/08/23 175 lb (79.4 kg)    GEN: Well nourished, well developed in no acute distress NECK: No JVD; No carotid bruits CARDIAC: regular and tachycardic, no murmurs, rubs, gallops RESPIRATORY: CTA b/l without rales, wheezing or rhonchi  ABDOMEN: Soft, non-tender, non-distended EXTREMITIES: trace edema; No deformity   ASSESSMENT AND PLAN: .    paroxysmal AFib CHA2DS2Vasc is 4, on Eliquis ,  appropriately dosed (weight/creat) Off flecainide  2/2 reduced EF  2:1 AFlutter today > home vitals note HRs consistently 120's since ~ 5/20 BPs have been stable by her home record and here She is unaware of her RVR with no cardiac awareness but has been more easily winded the last week or so More tired  Discussed given she has been tachycardic for a few weeks now, need to correct her rhythm ASAP, and rational (looks like we can get her done tomorrow) She has had a DCCV in the past, we re-reviewed the procedure/potential risks Will start amiodarone (discussed potential side effects and surveillance) 400mg  BID for 1 week 200mg  BID for 2 weeks 200mg  daily  Informed Consent   Shared Decision Making/Informed Consent The risks (stroke, cardiac arrhythmias rarely resulting in the need for a temporary or permanent pacemaker, skin irritation or burns and complications associated with conscious sedation including aspiration, arrhythmia, respiratory failure and death), benefits (restoration of normal sinus rhythm) and alternatives of a direct current cardioversion were explained in detail to [name] and she agrees to proceed.         HTN Looks OK  Secondary hypercoagulable state 2/2 AFib  4. DOE Mild reduction in LVEF She does not appear overtly volume OL She is getting established with our general cardiology team Sees Dr. [place] next week.    Dispo: back in 1 mo with EP again, sooner if needed, sooner if needed Signed, Debbie Fails, PA-C

## 2024-03-02 NOTE — Progress Notes (Unsigned)
 Cardiology Office Note:  .   Date:  03/02/2024  ID:  Kristin Hancock, DOB 1931/05/26, MRN [id number] PCP: Kristin Grim, MD  [facility] Providers Cardiologist:  None Electrophysiologist:  Kristin Byes, MD {  History of Present Illness: .   Kristin Hancock is a 89 y.o. female w/PMHx of  HTN, HLD AFib  Saw Dr. Marven Hancock 06/29/23, doing well, no Afib, cutting flecainide  (for75mg  dose) was hard >> dose reduced to 50mg  BID Planned for 6 mo APP visit  I saw her 12/26/23 She reports DOE Walking room to room even in her house gets her winded She is fairly sedentary perhaps primarily 2/2 her b/l knee and back arthritis DOE is not necessarily new or changing but worried her EKG w/stable intervals Did not appear volume OL, but planned for an echo   LVEF 45-50% Given this her flecainide  stopped (Dr. Marven Hancock was in agreement) And recommended to come in to review medications    Today's visit is scheduled to follow up on echo and med changes ROS:   She is accompanied by her daughter today By checking her daily BP/vitals she has observed that her HR has been steadily in the 102's since about May 20th No cardiac awareness No CP Since then perhaps a little more winded, her daughter adds infrequently but mentioned some slight dizziness. No rest SOB or symptoms of PND/orthopnea No near syncope or syncope She reports excellent medication compliance, never misses her Eliquis  and is certain she has not missed any in 3 weeks (if ever)   Arrhythmia/AAD hx Afib goes back at least to 2021 (likely prior to that having been treated previously in [place] > [place] in March 2022 already on flecainide ) Flecainide  > dose increase march 2022 >> reduced back to 50mg  BID Oct 2024 2/2 difficulty cutting tabs>>> remains current Flecainide  stopped May 2025 2/2 abnormal echo/reduced LVEF  Studies Reviewed: Kristin Hancock    EKG done today and reviewed by myself AFlutter 2:1, 129bpm  SR 70bpm, low voltage,  poor R progression PR , QRS 86ms, QTc No significant changes noted  01/29/24: TTE 1. Left ventricular ejection fraction, by estimation, is 45 to 50%. The  left ventricle has mildly decreased function. The left ventricle  demonstrates global hypokinesis. Left ventricular diastolic function could  not be evaluated.   2. Right ventricular systolic function is mildly reduced. The right  ventricular size is normal. There is moderately elevated pulmonary artery  systolic pressure. The estimated right ventricular systolic pressure is  49.0 mmHg.   3. Left atrial size was mild to moderately dilated.   4. The mitral valve is degenerative. Trivial mitral valve regurgitation.  Mild mitral stenosis. The mean mitral valve gradient is 6.3 mmHg with  average heart rate of 99 bpm. Moderate mitral annular calcification.   5. Tricuspid valve regurgitation is mild to moderate.   6. The aortic valve is tricuspid. There is mild calcification of the  aortic valve. There is mild thickening of the aortic valve. Aortic valve  regurgitation is not visualized. Aortic valve sclerosis/calcification is  present, without any evidence of  aortic stenosis.   7. The inferior vena cava is normal in size with greater than 50%  respiratory variability, suggesting right atrial pressure of 3 mmHg.    2016 scanned echo with normal LVEF  Risk Assessment/Calculations:    Physical Exam:   VS:  LMP  (LMP Unknown)    Wt Readings from Last 3 Encounters:  12/26/23 165 lb 6.4 oz (75  kg)  06/29/23 171 lb 6.4 oz (77.7 kg)  01/08/23 175 lb (79.4 kg)    GEN: Well nourished, well developed in no acute distress NECK: No JVD; No carotid bruits CARDIAC: regular and tachycardic, no murmurs, rubs, gallops RESPIRATORY: CTA b/l without rales, wheezing or rhonchi  ABDOMEN: Soft, non-tender, non-distended EXTREMITIES: trace edema; No deformity   ASSESSMENT AND PLAN: .    paroxysmal AFib CHA2DS2Vasc is 4, on Eliquis ,  appropriately dosed (weight/creat) Off flecainide  2/2 reduced EF  2:1 AFlutter today > home vitals note HRs consistently 120's since ~ 5/20 BPs have been stable by her home record and here She is unaware of her RVR with no cardiac awareness but has been more easily winded the last week or so More tired  Discussed given she has been tachycardic for a few weeks now, need to correct her rhythm ASAP, and rational (looks like we can get her done tomorrow) She has had a DCCV in the past, we re-reviewed the procedure/potential risks Will start amiodarone (discussed potential side effects and surveillance) 400mg  BID for 1 week 200mg  BID for 2 weeks 200mg  daily  Informed Consent   Shared Decision Making/Informed Consent The risks (stroke, cardiac arrhythmias rarely resulting in the need for a temporary or permanent pacemaker, skin irritation or burns and complications associated with conscious sedation including aspiration, arrhythmia, respiratory failure and death), benefits (restoration of normal sinus rhythm) and alternatives of a direct current cardioversion were explained in detail to Kristin Hancock and she agrees to proceed.         HTN Looks OK  Secondary hypercoagulable state 2/2 AFib  4. DOE Mild reduction in LVEF She does not appear overtly volume OL She is getting established with our general cardiology team Sees Dr. [place] next week.    Dispo: back in 1 mo with EP again, sooner if needed, sooner if needed Signed, Kristin Fails, PA-C

## 2024-03-04 ENCOUNTER — Encounter: Payer: Self-pay | Admitting: Physician Assistant

## 2024-03-04 ENCOUNTER — Ambulatory Visit: Attending: Cardiology | Admitting: Physician Assistant

## 2024-03-04 ENCOUNTER — Encounter: Payer: Self-pay | Admitting: *Deleted

## 2024-03-04 VITALS — BP 124/76 | HR 129 | Ht 62.0 in | Wt 171.0 lb

## 2024-03-04 DIAGNOSIS — I428 Other cardiomyopathies: Secondary | ICD-10-CM

## 2024-03-04 DIAGNOSIS — R000 Tachycardia, unspecified: Secondary | ICD-10-CM | POA: Diagnosis not present

## 2024-03-04 DIAGNOSIS — I10 Essential (primary) hypertension: Secondary | ICD-10-CM

## 2024-03-04 DIAGNOSIS — I480 Paroxysmal atrial fibrillation: Secondary | ICD-10-CM

## 2024-03-04 DIAGNOSIS — I483 Typical atrial flutter: Secondary | ICD-10-CM | POA: Diagnosis not present

## 2024-03-04 DIAGNOSIS — Z79899 Other long term (current) drug therapy: Secondary | ICD-10-CM

## 2024-03-04 DIAGNOSIS — D6869 Other thrombophilia: Secondary | ICD-10-CM

## 2024-03-04 LAB — CBC

## 2024-03-04 MED ORDER — AMIODARONE HCL 200 MG PO TABS: 200.0000 mg | ORAL_TABLET | Freq: Every day | ORAL | 3 refills | Status: DC

## 2024-03-04 NOTE — Patient Instructions (Addendum)
 Medication Instructions:   START TAKING : AMIODARONE 400 MG TWICE A DAY FOR ONE WEEK  THEN TAKE :  AMIODARONE 200 MG TWICE A DAY FOR  2 WEEKS   THEN RESUME TAKING : AMIODARONE 200 MG ONCE A DAY   *If you need a refill on your cardiac medications before your next appointment, please call your pharmacy*    Lab Work: PLEASE GO DOWN STAIRS  [facility]  FIRST FLOOR   ( GET OFF ELEVATORS WALK TOWARDS WAITING AREA LAB LOCATED BY PHARMACY): BMET  CBC AND TSH TODAY      If you have labs (blood work) drawn today and your tests are completely normal, you will receive your results only by: MyChart Message (if you have MyChart) OR A paper copy in the mail If you have any lab test that is abnormal or we need to change your treatment, we will call you to review the results.    Testing/Procedures: ON  03-05-24 Your physician has recommended that you have a Cardioversion (DCCV). Electrical Cardioversion uses a jolt of electricity to your heart either through paddles or wired patches attached to your chest. This is a controlled, usually prescheduled, procedure. Defibrillation is done under light anesthesia in the hospital, and you usually go home the day of the procedure. This is done to get your heart back into a normal rhythm. You are not awake for the procedure. Please see the instruction sheet given to you today.    Follow-Up: At [facility], you and your health needs are our priority.  As part of our continuing mission to provide you with exceptional heart care, our providers are all part of one team.  This team includes your primary Cardiologist (physician) and Advanced Practice Providers or APPs (Physician Assistants and Nurse Practitioners) who all work together to provide you with the care you need, when you need it.   Your next appointment:    1 month(s) ( CONTACT  CASSIE HALL/ ANGELINE HAMMER FOR EP SCHEDULING ISSUES )   Provider:   You may see  Mertha Abrahams, PA-C   We  recommend signing up for the patient portal called "MyChart".  Sign up information is provided on this After Visit Summary.  MyChart is used to connect with patients for Virtual Visits (Telemedicine).  Patients are able to view lab/test results, encounter notes, upcoming appointments, etc.  Non-urgent messages can be sent to your provider as well.   To learn more about what you can do with MyChart, go to [email or web address].    Other Instructions

## 2024-03-04 NOTE — Progress Notes (Signed)
 Pt did not answer phone call. No VM set up.Voicemail left for daughter, Jerrold Morgan, regarding pre-procedure instructions

## 2024-03-04 NOTE — Progress Notes (Signed)
 Pt returned RN's phone call. Pre procedure instructions given with teach back.

## 2024-03-05 ENCOUNTER — Encounter (HOSPITAL_COMMUNITY): Payer: Self-pay | Admitting: Cardiology

## 2024-03-05 ENCOUNTER — Ambulatory Visit (HOSPITAL_COMMUNITY)
Admission: RE | Admit: 2024-03-05 | Discharge: 2024-03-05 | Disposition: A | Discharge status: Home / Self Care | Source: Home / Self Care | Attending: Cardiovascular Disease | Admitting: Cardiovascular Disease

## 2024-03-05 ENCOUNTER — Encounter (HOSPITAL_COMMUNITY)
Admission: RE | Disposition: A | Discharge status: Home / Self Care | Payer: Self-pay | Source: Home / Self Care | Attending: Cardiovascular Disease

## 2024-03-05 ENCOUNTER — Other Ambulatory Visit: Payer: Self-pay

## 2024-03-05 ENCOUNTER — Ambulatory Visit (HOSPITAL_COMMUNITY)

## 2024-03-05 ENCOUNTER — Ambulatory Visit: Payer: Self-pay | Admitting: Physician Assistant

## 2024-03-05 DIAGNOSIS — D6869 Other thrombophilia: Secondary | ICD-10-CM | POA: Insufficient documentation

## 2024-03-05 DIAGNOSIS — I4891 Unspecified atrial fibrillation: Secondary | ICD-10-CM

## 2024-03-05 DIAGNOSIS — I480 Paroxysmal atrial fibrillation: Secondary | ICD-10-CM | POA: Insufficient documentation

## 2024-03-05 DIAGNOSIS — I10 Essential (primary) hypertension: Secondary | ICD-10-CM

## 2024-03-05 DIAGNOSIS — I4819 Other persistent atrial fibrillation: Secondary | ICD-10-CM

## 2024-03-05 DIAGNOSIS — E785 Hyperlipidemia, unspecified: Secondary | ICD-10-CM

## 2024-03-05 DIAGNOSIS — R0609 Other forms of dyspnea: Secondary | ICD-10-CM | POA: Insufficient documentation

## 2024-03-05 DIAGNOSIS — I130 Hypertensive heart and chronic kidney disease with heart failure and stage 1 through stage 4 chronic kidney disease, or unspecified chronic kidney disease: Secondary | ICD-10-CM | POA: Diagnosis not present

## 2024-03-05 DIAGNOSIS — Z79899 Other long term (current) drug therapy: Secondary | ICD-10-CM | POA: Insufficient documentation

## 2024-03-05 DIAGNOSIS — I4892 Unspecified atrial flutter: Secondary | ICD-10-CM | POA: Insufficient documentation

## 2024-03-05 DIAGNOSIS — Z7901 Long term (current) use of anticoagulants: Secondary | ICD-10-CM | POA: Insufficient documentation

## 2024-03-05 DIAGNOSIS — R0602 Shortness of breath: Secondary | ICD-10-CM | POA: Diagnosis not present

## 2024-03-05 HISTORY — PX: CARDIOVERSION: EP1203

## 2024-03-05 LAB — CBC
Hematocrit: 39.3 % (ref 34.0–46.6)
Hemoglobin: 12.2 g/dL (ref 11.1–15.9)
MCH: 27.4 pg (ref 26.6–33.0)
MCHC: 31 g/dL — ABNORMAL LOW (ref 31.5–35.7)
MCV: 88 fL (ref 79–97)
Platelets: 206 10*3/uL (ref 150–450)
RBC: 4.46 x10E6/uL (ref 3.77–5.28)
RDW: 15.7 % — ABNORMAL HIGH (ref 11.7–15.4)
WBC: 9.9 10*3/uL (ref 3.4–10.8)

## 2024-03-05 LAB — COMPREHENSIVE METABOLIC PANEL WITH GFR
ALT: 13 IU/L (ref 0–32)
AST: 20 IU/L (ref 0–40)
Albumin: 4 g/dL (ref 3.6–4.6)
Alkaline Phosphatase: 84 IU/L (ref 44–121)
BUN/Creatinine Ratio: 40 — ABNORMAL HIGH (ref 12–28)
BUN: 38 mg/dL — ABNORMAL HIGH (ref 10–36)
Bilirubin Total: 0.3 mg/dL (ref 0.0–1.2)
CO2: 20 mmol/L (ref 20–29)
Calcium: 9.4 mg/dL (ref 8.7–10.3)
Chloride: 106 mmol/L (ref 96–106)
Creatinine, Ser: 0.94 mg/dL (ref 0.57–1.00)
Globulin, Total: 3.4 g/dL (ref 1.5–4.5)
Glucose: 112 mg/dL — ABNORMAL HIGH (ref 70–99)
Potassium: 4.1 mmol/L (ref 3.5–5.2)
Sodium: 143 mmol/L (ref 134–144)
Total Protein: 7.4 g/dL (ref 6.0–8.5)
eGFR: 57 mL/min/{1.73_m2} — ABNORMAL LOW (ref >59)

## 2024-03-05 LAB — TSH: TSH: 1.79 u[IU]/mL (ref 0.450–4.500)

## 2024-03-05 SURGERY — CARDIOVERSION (CATH LAB)
Anesthesia: General

## 2024-03-05 MED ORDER — SODIUM CHLORIDE 0.9% FLUSH
INTRAVENOUS | Status: DC | PRN
  Administered 2024-03-05: 10 mL via INTRAVENOUS

## 2024-03-05 MED ORDER — PROPOFOL 10 MG/ML IV BOLUS
INTRAVENOUS | Status: DC | PRN
Start: 2024-03-05 — End: 2024-03-05
  Administered 2024-03-05: 50 mg via INTRAVENOUS

## 2024-03-05 MED ORDER — LIDOCAINE 2% (20 MG/ML) 5 ML SYRINGE
INTRAMUSCULAR | Status: DC | PRN
  Administered 2024-03-05: 50 mg via INTRAVENOUS

## 2024-03-05 SURGICAL SUPPLY — 1 items: PAD DEFIB RADIO PHYSIO CONN (PAD) ×1 IMPLANT

## 2024-03-05 NOTE — Anesthesia Postprocedure Evaluation (Signed)
 Anesthesia Post Note  Patient: Kristin Hancock  Procedure(s) Performed: CARDIOVERSION     Patient location during evaluation: PACU Anesthesia Type: General Level of consciousness: awake and alert and oriented Pain management: pain level controlled Vital Signs Assessment: post-procedure vital signs reviewed and stable Respiratory status: spontaneous breathing, nonlabored ventilation and respiratory function stable Cardiovascular status: blood pressure returned to baseline and stable Postop Assessment: no apparent nausea or vomiting Anesthetic complications: no   No notable events documented.  Last Vitals:  Vitals:   03/05/24 1050 03/05/24 1055  BP: (!) 91/49 (!) 88/50  Pulse: (!) 51 (!) 53  Resp: 13 12  Temp:    SpO2: 98% 99%    Last Pain:  Vitals:   03/05/24 1033  TempSrc: Tympanic  PainSc: 0-No pain                 [name] A.

## 2024-03-05 NOTE — Procedures (Signed)
 Electrical Cardioversion Procedure Note Kristin Hancock [id number] 1930/11/21  Procedure: Electrical Cardioversion Indications:  Atrial Fibrillation  Procedure Details Consent: Risks of procedure as well as the alternatives and risks of each were explained to the (patient/caregiver).  Consent for procedure obtained. Time Out: Verified patient identification, verified procedure, site/side was marked, verified correct patient position, special equipment/implants available, medications/allergies/relevent history reviewed, required imaging and test results available.  Performed  Patient placed on cardiac monitor, pulse oximetry, supplemental oxygen as necessary.  Sedation given: Propofol  per anesthesiology Pacer pads placed anterior and posterior chest.  Cardioverted 1 time(s).  Cardioverted at 200J.  Evaluation Findings: Post procedure EKG shows: NSR Complications: None Patient did tolerate procedure well.   Kristin Hancock 03/05/2024, 10:28 AM

## 2024-03-05 NOTE — Interval H&P Note (Signed)
 History and Physical Interval Note:  03/05/2024 10:04 AM  Kristin Hancock  has presented today for surgery, with the diagnosis of AFIB.  The various methods of treatment have been discussed with the patient and family. After consideration of risks, benefits and other options for treatment, the patient has consented to  Procedure(s): CARDIOVERSION (N/A) as a surgical intervention.  The patient's history has been reviewed, patient examined, no change in status, stable for surgery.  I have reviewed the patient's chart and labs.  Questions were answered to the patient's satisfaction.     Stephanos Fan [employer]

## 2024-03-05 NOTE — Anesthesia Preprocedure Evaluation (Signed)
 Anesthesia Evaluation  Patient identified by MRN, date of birth, ID band Patient awake    Reviewed: Allergy & Precautions, NPO status , Patient's Chart, lab work & pertinent test results, reviewed documented beta blocker date and time   Airway Mallampati: II  TM Distance: >3 FB     Dental  (+) Partial Upper, Dental Advisory Given   Pulmonary neg pulmonary ROS   Pulmonary exam normal breath sounds clear to auscultation       Cardiovascular hypertension, Pt. on medications and Pt. on home beta blockers Normal cardiovascular exam+ dysrhythmias Atrial Fibrillation  Rhythm:Irregular Rate:Tachycardia  Echo 01/29/24 1. Left ventricular ejection fraction, by estimation, is 45 to 50%. The  left ventricle has mildly decreased function. The left ventricle  demonstrates global hypokinesis. Left ventricular diastolic function could  not be evaluated.   2. Right ventricular systolic function is mildly reduced. The right  ventricular size is normal. There is moderately elevated pulmonary artery  systolic pressure. The estimated right ventricular systolic pressure is  49.0 mmHg.   3. Left atrial size was mild to moderately dilated.   4. The mitral valve is degenerative. Trivial mitral valve regurgitation.  Mild mitral stenosis. The mean mitral valve gradient is 6.3 mmHg with  average heart rate of 99 bpm. Moderate mitral annular calcification.   5. Tricuspid valve regurgitation is mild to moderate.   6. The aortic valve is tricuspid. There is mild calcification of the  aortic valve. There is mild thickening of the aortic valve. Aortic valve  regurgitation is not visualized. Aortic valve sclerosis/calcification is  present, without any evidence of  aortic stenosis.   7. The inferior vena cava is normal in size with greater than 50%  respiratory variability, suggesting right atrial pressure of 3 mmHg.   EKG 03/04/24 Atrial flutter 2:1    Neuro/Psych Macular degeneration  negative psych ROS   GI/Hepatic negative GI ROS, Neg liver ROS,,,  Endo/Other  HLD Obesity  Renal/GU negative Renal ROS  negative genitourinary   Musculoskeletal  (+) Arthritis , Osteoarthritis,    Abdominal  (+) + obese  Peds  Hematology Eliquis  therapy- last dose this am   Anesthesia Other Findings   Reproductive/Obstetrics                              Anesthesia Physical Anesthesia Plan  ASA: 3  Anesthesia Plan: General   Post-op Pain Management: Minimal or no pain anticipated   Induction: Intravenous  PONV Risk Score and Plan: 3 and Treatment may vary due to age or medical condition and Propofol  infusion  Airway Management Planned: Natural Airway and Nasal Cannula  Additional Equipment: None  Intra-op Plan:   Post-operative Plan:   Informed Consent: I have reviewed the patients History and Physical, chart, labs and discussed the procedure including the risks, benefits and alternatives for the proposed anesthesia with the patient or authorized representative who has indicated his/her understanding and acceptance.     Dental advisory given  Plan Discussed with: CRNA and Anesthesiologist  Anesthesia Plan Comments:          Anesthesia Quick Evaluation

## 2024-03-05 NOTE — Transfer of Care (Signed)
 Immediate Anesthesia Transfer of Care Note  Patient: Kristin Hancock  Procedure(s) Performed: CARDIOVERSION  Patient Location: PACU and Cath Lab  Anesthesia Type:General (MAC- not General)  Level of Consciousness: awake, alert , and oriented  Airway & Oxygen Therapy: Patient Spontanous Breathing and Patient connected to nasal cannula oxygen  Post-op Assessment: Report given to RN and Post -op Vital signs reviewed and stable  Post vital signs: Reviewed and stable  Last Vitals:  Vitals Value Taken Time  BP 94/50 03/05/24 1028  Temp    Pulse 56 03/05/24 1028  Resp 20 03/05/24 1028  SpO2 100 % 03/05/24 1028  Vitals shown include unfiled device data.  Last Pain:  Vitals:   03/05/24 1017  TempSrc:   PainSc: 0-No pain         Complications: No notable events documented.

## 2024-03-06 NOTE — Progress Notes (Deleted)
 Cardiology Office Note:    Date:  03/06/2024   ID:  Kristin Hancock, DOB [birth year], MRN [id number]  PCP:  Elester Grim, MD   [facility] Providers Cardiologist:  None Electrophysiologist:  Boyce Byes, MD { Click to update primary MD,subspecialty MD or APP then REFRESH:1}    Referring MD: Elester Grim, MD   No chief complaint on file. ***  History of Present Illness:    Kristin Hancock is a 89 y.o. female is seen at the request of Dr Marven Slimmer for evaluation of reduced EF. He has a history of HTN, HLD and Atrial fibrillation. He has had Afib since 2021. He was on Flecainide  before but Echo this year showed reduced EF 45-50% so Flecainide  was stopped. He had recurrent Afib and underwent DCCV on 03/05/24. Prior Myoview and Echo in 2016 were normal.   Past Medical History:  Diagnosis Date   Atrial fibrillation (HCC)    Hypercholesterolemia    Hypertension    Overactive bladder     Past Surgical History:  Procedure Laterality Date   CARDIOVERSION N/A 12/17/2020   Procedure: CARDIOVERSION;  Surgeon: Jacqueline Matsu, MD;  Location: [facility] ENDOSCOPY;  Service: Cardiovascular;  Laterality: N/A;   CARDIOVERSION N/A 03/05/2024   Procedure: CARDIOVERSION;  Surgeon: Darlis Eisenmenger, MD;  Location: [facility] INVASIVE CV LAB;  Service: Cardiovascular;  Laterality: N/A;    Current Medications: No outpatient medications have been marked as taking for the 03/10/24 encounter (Appointment) with [place], Kailo Kosik M, MD.     Allergies:   Patient has no known allergies.   Social History   Socioeconomic History   Marital status: Married    Spouse name: Not on file   Number of children: Not on file   Years of education: Not on file   Highest education level: Not on file  Occupational History   Not on file  Tobacco Use   Smoking status: Never   Smokeless tobacco: Never  Substance and Sexual Activity   Alcohol use: Not on file   Drug use: Not on file   Sexual activity:  Not on file  Other Topics Concern   Not on file  Social History Narrative   Not on file   Social Drivers of Health   Financial Resource Strain: High Risk (01/07/2020)   Received from [facility]   Financial Resource Strain    Difficulty Paying Living Expenses: Not hard at all    Difficulty Paying Medical Expenses: Yes  Food Insecurity: No Food Insecurity (12/02/2020)   Received from [facility]   Food Insecurity    Worried about Running Out of Food in the Last Year: Never true    Ran Out of Food in the Last Year: Never true  Transportation Needs: Unmet Transportation Needs (01/07/2020)   Received from [facility]   Transportation Needs    Lack of Transportation: Yes  Physical Activity: Insufficiently Active (01/07/2020)   Received from [facility]   Physical Activity    Days of Exercise per Week: 3 days    Minutes of Exercise per Session: 30    Total Minutes of Exercise per Week: 90  Stress: No Stress Concern Present (01/07/2020)   Received from [facility]   Stress    Feeling of Stress : Not at all  Social Connections: Unknown (01/23/2021)   Received from [facility]   Social Connections    Frequency of Communication with Friends and Family: Not asked    Frequency of Social Gatherings  with Friends and Family: Not asked     Family History: The patient's ***family history includes CAD in her brother; COPD in her father; Diabetes in her brother and maternal grandmother; Heart failure in her mother.  ROS:   Please see the history of present illness.    *** All other systems reviewed and are negative.  EKGs/Labs/Other Studies Reviewed:    The following studies were reviewed today: Echo 01/29/24: IMPRESSIONS     1. Left ventricular ejection fraction, by estimation, is 45 to 50%. The  left ventricle has mildly decreased function. The left ventricle  demonstrates global hypokinesis. Left ventricular diastolic function could  not be evaluated.   2. Right ventricular  systolic function is mildly reduced. The right  ventricular size is normal. There is moderately elevated pulmonary artery  systolic pressure. The estimated right ventricular systolic pressure is  49.0 mmHg.   3. Left atrial size was mild to moderately dilated.   4. The mitral valve is degenerative. Trivial mitral valve regurgitation.  Mild mitral stenosis. The mean mitral valve gradient is 6.3 mmHg with  average heart rate of 99 bpm. Moderate mitral annular calcification.   5. Tricuspid valve regurgitation is mild to moderate.   6. The aortic valve is tricuspid. There is mild calcification of the  aortic valve. There is mild thickening of the aortic valve. Aortic valve  regurgitation is not visualized. Aortic valve sclerosis/calcification is  present, without any evidence of  aortic stenosis.   7. The inferior vena cava is normal in size with greater than 50%  respiratory variability, suggesting right atrial pressure of 3 mmHg.        Recent Labs: 03/04/2024: ALT 13; BUN 38; Creatinine, Ser 0.94; Hemoglobin 12.2; Platelets 206; Potassium 4.1; Sodium 143; TSH 1.790  Recent Lipid Panel No results found for: CHOL, TRIG, HDL, CHOLHDL, VLDL, LDLCALC, LDLDIRECT   Risk Assessment/Calculations:   {Does this patient have ATRIAL FIBRILLATION?[phone]}            Physical Exam:    VS:  LMP  (LMP Unknown)     Wt Readings from Last 3 Encounters:  03/04/24 171 lb (77.6 kg)  12/26/23 165 lb 6.4 oz (75 kg)  06/29/23 171 lb 6.4 oz (77.7 kg)     GEN: *** Well nourished, well developed in no acute distress HEENT: Normal NECK: No JVD; No carotid bruits LYMPHATICS: No lymphadenopathy CARDIAC: ***RRR, no murmurs, rubs, gallops RESPIRATORY:  Clear to auscultation without rales, wheezing or rhonchi  ABDOMEN: Soft, non-tender, non-distended MUSCULOSKELETAL:  No edema; No deformity  SKIN: Warm and dry NEUROLOGIC:  Alert and oriented x 3 PSYCHIATRIC:  Normal affect    ASSESSMENT:    No diagnosis found. PLAN:    In order of problems listed above:  ***      {Are you ordering a CV Procedure (e.g. stress test, cath, DCCV, TEE, etc)?   Press F2        :[id number]}    Medication Adjustments/Labs and Tests Ordered: Current medicines are reviewed at length with the patient today.  Concerns regarding medicines are outlined above.  No orders of the defined types were placed in this encounter.  No orders of the defined types were placed in this encounter.   There are no Patient Instructions on file for this visit.   Signed, Avy Barlett [place], MD  03/06/2024 2:48 PM    [facility]

## 2024-03-08 ENCOUNTER — Emergency Department (HOSPITAL_BASED_OUTPATIENT_CLINIC_OR_DEPARTMENT_OTHER)

## 2024-03-08 ENCOUNTER — Telehealth: Payer: Self-pay | Admitting: Physician Assistant

## 2024-03-08 ENCOUNTER — Inpatient Hospital Stay (HOSPITAL_BASED_OUTPATIENT_CLINIC_OR_DEPARTMENT_OTHER)
Admission: EM | Admit: 2024-03-08 | Discharge: 2024-03-13 | DRG: 291 | Disposition: A | Discharge status: Home Health | Attending: Internal Medicine | Admitting: Internal Medicine

## 2024-03-08 ENCOUNTER — Other Ambulatory Visit: Payer: Self-pay

## 2024-03-08 ENCOUNTER — Encounter (HOSPITAL_BASED_OUTPATIENT_CLINIC_OR_DEPARTMENT_OTHER): Payer: Self-pay | Admitting: Emergency Medicine

## 2024-03-08 DIAGNOSIS — G4730 Sleep apnea, unspecified: Secondary | ICD-10-CM | POA: Diagnosis present

## 2024-03-08 DIAGNOSIS — Z8249 Family history of ischemic heart disease and other diseases of the circulatory system: Secondary | ICD-10-CM | POA: Diagnosis not present

## 2024-03-08 DIAGNOSIS — Z7901 Long term (current) use of anticoagulants: Secondary | ICD-10-CM

## 2024-03-08 DIAGNOSIS — I10 Essential (primary) hypertension: Secondary | ICD-10-CM | POA: Diagnosis present

## 2024-03-08 DIAGNOSIS — I130 Hypertensive heart and chronic kidney disease with heart failure and stage 1 through stage 4 chronic kidney disease, or unspecified chronic kidney disease: Secondary | ICD-10-CM | POA: Diagnosis present

## 2024-03-08 DIAGNOSIS — E7800 Pure hypercholesterolemia, unspecified: Secondary | ICD-10-CM | POA: Diagnosis present

## 2024-03-08 DIAGNOSIS — D72829 Elevated white blood cell count, unspecified: Secondary | ICD-10-CM | POA: Diagnosis present

## 2024-03-08 DIAGNOSIS — I5023 Acute on chronic systolic (congestive) heart failure: Principal | ICD-10-CM | POA: Diagnosis present

## 2024-03-08 DIAGNOSIS — M479 Spondylosis, unspecified: Secondary | ICD-10-CM | POA: Diagnosis present

## 2024-03-08 DIAGNOSIS — N179 Acute kidney failure, unspecified: Secondary | ICD-10-CM | POA: Diagnosis present

## 2024-03-08 DIAGNOSIS — N3281 Overactive bladder: Secondary | ICD-10-CM | POA: Diagnosis present

## 2024-03-08 DIAGNOSIS — I5022 Chronic systolic (congestive) heart failure: Secondary | ICD-10-CM | POA: Diagnosis not present

## 2024-03-08 DIAGNOSIS — E782 Mixed hyperlipidemia: Secondary | ICD-10-CM | POA: Diagnosis not present

## 2024-03-08 DIAGNOSIS — Z6830 Body mass index (BMI) 30.0-30.9, adult: Secondary | ICD-10-CM

## 2024-03-08 DIAGNOSIS — N183 Chronic kidney disease, stage 3 unspecified: Secondary | ICD-10-CM | POA: Diagnosis not present

## 2024-03-08 DIAGNOSIS — Z833 Family history of diabetes mellitus: Secondary | ICD-10-CM

## 2024-03-08 DIAGNOSIS — R0602 Shortness of breath: Secondary | ICD-10-CM | POA: Diagnosis present

## 2024-03-08 DIAGNOSIS — N1831 Chronic kidney disease, stage 3a: Secondary | ICD-10-CM | POA: Diagnosis present

## 2024-03-08 DIAGNOSIS — I4892 Unspecified atrial flutter: Secondary | ICD-10-CM | POA: Diagnosis present

## 2024-03-08 DIAGNOSIS — I5021 Acute systolic (congestive) heart failure: Secondary | ICD-10-CM | POA: Diagnosis not present

## 2024-03-08 DIAGNOSIS — T502X5A Adverse effect of carbonic-anhydrase inhibitors, benzothiadiazides and other diuretics, initial encounter: Secondary | ICD-10-CM | POA: Diagnosis not present

## 2024-03-08 DIAGNOSIS — E669 Obesity, unspecified: Secondary | ICD-10-CM | POA: Diagnosis present

## 2024-03-08 DIAGNOSIS — E785 Hyperlipidemia, unspecified: Secondary | ICD-10-CM | POA: Diagnosis present

## 2024-03-08 DIAGNOSIS — Z79899 Other long term (current) drug therapy: Secondary | ICD-10-CM | POA: Diagnosis not present

## 2024-03-08 DIAGNOSIS — Z5986 Financial insecurity: Secondary | ICD-10-CM | POA: Diagnosis not present

## 2024-03-08 DIAGNOSIS — Z825 Family history of asthma and other chronic lower respiratory diseases: Secondary | ICD-10-CM

## 2024-03-08 DIAGNOSIS — I4819 Other persistent atrial fibrillation: Secondary | ICD-10-CM | POA: Diagnosis present

## 2024-03-08 LAB — BASIC METABOLIC PANEL WITH GFR
Anion gap: 15 (ref 5–15)
Anion gap: 11 (ref 5–15)
BUN: 46 mg/dL — ABNORMAL HIGH (ref 8–23)
BUN: 42 mg/dL — ABNORMAL HIGH (ref 8–23)
CO2: 20 mmol/L — ABNORMAL LOW (ref 22–32)
CO2: 23 mmol/L (ref 22–32)
Calcium: 9.6 mg/dL (ref 8.9–10.3)
Calcium: 9.3 mg/dL (ref 8.9–10.3)
Chloride: 106 mmol/L (ref 98–111)
Chloride: 107 mmol/L (ref 98–111)
Creatinine, Ser: 1.31 mg/dL — ABNORMAL HIGH (ref 0.44–1.00)
Creatinine, Ser: 1.14 mg/dL — ABNORMAL HIGH (ref 0.44–1.00)
GFR, Estimated: 38 mL/min — ABNORMAL LOW (ref >60)
GFR, Estimated: 45 mL/min — ABNORMAL LOW (ref >60)
Glucose, Bld: 112 mg/dL — ABNORMAL HIGH (ref 70–99)
Glucose, Bld: 106 mg/dL — ABNORMAL HIGH (ref 70–99)
Potassium: 4.6 mmol/L (ref 3.5–5.1)
Potassium: 3.8 mmol/L (ref 3.5–5.1)
Sodium: 141 mmol/L (ref 135–145)
Sodium: 141 mmol/L (ref 135–145)

## 2024-03-08 LAB — CBC
HCT: 34.5 % — ABNORMAL LOW (ref 36.0–46.0)
Hemoglobin: 11.1 g/dL — ABNORMAL LOW (ref 12.0–15.0)
MCH: 27.1 pg (ref 26.0–34.0)
MCHC: 32.2 g/dL (ref 30.0–36.0)
MCV: 84.4 fL (ref 80.0–100.0)
Platelets: 182 10*3/uL (ref 150–400)
RBC: 4.09 MIL/uL (ref 3.87–5.11)
RDW: 16.7 % — ABNORMAL HIGH (ref 11.5–15.5)
WBC: 7.9 10*3/uL (ref 4.0–10.5)
nRBC: 0 % (ref 0.0–0.2)

## 2024-03-08 LAB — MAGNESIUM: Magnesium: 1.8 mg/dL (ref 1.7–2.4)

## 2024-03-08 LAB — T4, FREE: Free T4: 1.13 ng/dL — ABNORMAL HIGH (ref 0.61–1.12)

## 2024-03-08 LAB — PRO BRAIN NATRIURETIC PEPTIDE: Pro Brain Natriuretic Peptide: 3665 pg/mL — ABNORMAL HIGH (ref <300.0)

## 2024-03-08 LAB — SEDIMENTATION RATE: Sed Rate: 55 mm/h — ABNORMAL HIGH (ref 0–22)

## 2024-03-08 LAB — TSH: TSH: 2.665 u[IU]/mL (ref 0.350–4.500)

## 2024-03-08 LAB — TROPONIN T, HIGH SENSITIVITY: Troponin T High Sensitivity: 24 ng/L — ABNORMAL HIGH (ref <19)

## 2024-03-08 MED ORDER — ONDANSETRON HCL 4 MG PO TABS: 4.0000 mg | ORAL_TABLET | Freq: Four times a day (QID) | ORAL | Status: DC | PRN

## 2024-03-08 MED ORDER — ONDANSETRON HCL 4 MG/2ML IJ SOLN: 4.0000 mg | Freq: Four times a day (QID) | INTRAMUSCULAR | Status: DC | PRN

## 2024-03-08 MED ORDER — FERROUS SULFATE 325 (65 FE) MG PO TABS
325.0000 mg | ORAL_TABLET | Freq: Every day | ORAL | Status: DC
  Administered 2024-03-09 – 2024-03-12 (×4): 325 mg via ORAL
  Filled 2024-03-08 (×5): qty 1

## 2024-03-08 MED ORDER — FUROSEMIDE 10 MG/ML IJ SOLN
20.0000 mg | Freq: Once | INTRAMUSCULAR | Status: AC
  Filled 2024-03-08: qty 2

## 2024-03-08 MED ORDER — AMIODARONE HCL 200 MG PO TABS
400.0000 mg | ORAL_TABLET | Freq: Two times a day (BID) | ORAL | Status: DC
  Administered 2024-03-09 (×2): 400 mg via ORAL
  Filled 2024-03-08 (×4): qty 2

## 2024-03-08 MED ORDER — AMIODARONE HCL 200 MG PO TABS: 200.0000 mg | ORAL_TABLET | Freq: Two times a day (BID) | ORAL | Status: DC

## 2024-03-08 MED ORDER — METOPROLOL TARTRATE 5 MG/5ML IV SOLN: 5.0000 mg | Freq: Four times a day (QID) | INTRAVENOUS | Status: DC | PRN

## 2024-03-08 MED ORDER — IOHEXOL 350 MG/ML SOLN: 75.0000 mL | Freq: Once | INTRAVENOUS | Status: AC | PRN

## 2024-03-08 MED ORDER — APIXABAN 5 MG PO TABS
5.0000 mg | ORAL_TABLET | Freq: Two times a day (BID) | ORAL | Status: DC
  Administered 2024-03-09 – 2024-03-12 (×6): 5 mg via ORAL
  Filled 2024-03-08 (×10): qty 1

## 2024-03-08 MED ORDER — SIMVASTATIN 20 MG PO TABS
20.0000 mg | ORAL_TABLET | Freq: Every day | ORAL | Status: DC
  Administered 2024-03-09 – 2024-03-12 (×4): 20 mg via ORAL
  Filled 2024-03-08 (×5): qty 1

## 2024-03-08 MED ORDER — ACETAMINOPHEN 650 MG RE SUPP: 650.0000 mg | Freq: Four times a day (QID) | RECTAL | Status: DC | PRN

## 2024-03-08 MED ORDER — ACETAMINOPHEN 325 MG PO TABS
650.0000 mg | ORAL_TABLET | Freq: Four times a day (QID) | ORAL | Status: DC | PRN
Start: 2024-03-08 — End: 2024-03-13
  Administered 2024-03-09: 650 mg via ORAL
  Filled 2024-03-08: qty 2

## 2024-03-08 NOTE — ED Triage Notes (Signed)
 Cardioversion on Wednesday. Started taking amiodarone .   Reports increased weakness and SHOB since. Bilateral leg swelling.

## 2024-03-08 NOTE — H&P (Signed)
 History and Physical  Patient: Kristin Hancock WUJ:[id number] DOB: 1931-01-14 DOA: 03/08/2024 DOS: the patient was seen and examined on 03/08/2024 Patient coming from: Home  Chief Complaint: Shortness of breath  HPI: Patient with PMH of persistent A-fib, chronic systolic CHF, HTN, HLD, obesity presented to the hospital with complaints of progressively worsening shortness of breath. Patient has known history of A-fib was on flecainide  in the past.  Recent echocardiogram performed for her complaints of shortness of breath showed an EF of 45%.  She was asked to stop the flecainide .  Presented to the hospital on 6/11 for cardioversion. Discharged home on amiodarone . After going home she continues to have shortness of breath after discharge and also had some episodes of dizziness. No chest pain no chest tightness reported by the patient.  No nausea no vomiting or no diarrhea no constipation.  No other focal deficit. Patient reports that she is regimented about her medication compliance. She denies any other change in her medication. At home blood pressure has been running in 70s systolic and 80s systolic. Heart rate has been in 50s and 60s. She called the on-call cardiology doctor who recommended her to come to the [facility] and from where she was brought to the hospital for treatment. She denies any orthopnea or PND. She has chronic swelling of her left leg including swelling of her ankle without any worsening. She does not check her weight on a daily basis.  Assessment and Plan: Acute on chronic systolic CHF Recent echocardiogram as above showed EF of 45 to 50%. Mild MS, mild TR. Recently underwent cardioversion. Was taking atenolol  50 mg twice daily as prescribed. Received IV Lasix with improvement in renal function as well as her symptoms. Will continue with IV Lasix and recheck the renal function in the morning. Being gentle with the diuresis given no need for oxygenation as well  as elevated renal function and patient receiving IV contrast for CT PE protocol. Cardiology consult recommended although will perform in the morning as it will not change management at present.  Persistent atrial fibrillation  Patient is on amiodarone  and Eliquis . Currently undergoing amiodarone  load current doses 400 mg twice daily.  Starting 6/18 we will be reducing it to 200 mg twice daily for 2 weeks followed by 200 mg daily. Given the recency of the amiodarone  use suspect that the amiodarone  induced pulmonary toxicity is less likely although will check an ESR and CRP.  HTN  Blood pressure is very soft. Patient has had family gram amlodipine , 50 mg atenolol , 12.5 mg HCTZ and 100 mg Cozaar . Currently I will be holding all medications. May need to be on GDMT for her systolic dysfunction. Will defer to cardiology in the morning.  HLD  On statin. Monitor.  AKI Likely cardiorenal hemodynamics. Recent serum creatinine 0.9 on 6/10. Serum creatinine raised to 1.31 on 6/14. Improving after diuresis to 1.1. Monitor closely.  Goals of care conversation. Discussed in details with regards to her goals of care. Wants to be full code for now. Wants her daughter to be her HCPOA.   Advance Care Planning:   Code Status: Full Code  Consults: Cardiology  Prior to Admission medications   Medication Sig Start Date End Date Taking? Authorizing Provider  acetaminophen (TYLENOL) 500 MG tablet Take 1,000 mg by mouth 2 (two) times daily as needed for moderate pain (pain score 4-6), fever or headache.   Yes [provider]  amiodarone  (PACERONE ) 200 MG tablet Take 1 tablet (200 mg total) by  mouth daily. SPECIAL INSTRUCTIONS: FOR 1 WEEK 400 MG TWICE A DAY  FOR 2 WEEKS 200 MG TWICE A DAY THEN TAKE 200 MG  DAILY 03/04/24  Yes Debbie Fails, PA-C  amLODipine  (NORVASC ) 5 MG tablet Take 1 tablet (5 mg total) by mouth daily. 12/19/23  Yes Boyce Byes, MD  atenolol  (TENORMIN ) 50 MG tablet  Take 1 tablet (50 mg total) by mouth daily. 12/19/23  Yes Boyce Byes, MD  Avacincaptad Pegol (IZERVAY) 2 MG/0.1ML SOLN 1 mg by Intravitreal route every 2 (two) months.   Yes [provider]  Calcium Carb-Cholecalciferol (CALCIUM + VITAMIN D3 PO) Take 1 tablet by mouth daily.   Yes [provider]  ELIQUIS  5 MG TABS tablet TAKE 1 TABLET BY MOUTH TWICE  DAILY 10/22/23  Yes Boyce Byes, MD  ferrous sulfate 325 (65 FE) MG tablet Take 325 mg by mouth daily.   Yes [provider]  hydrochlorothiazide  (HYDRODIURIL ) 12.5 MG tablet TAKE 1 TABLET BY MOUTH DAILY 01/25/24  Yes Boyce Byes, MD  losartan  (COZAAR ) 100 MG tablet Take 1 tablet (100 mg total) by mouth daily. 12/19/23  Yes Boyce Byes, MD  Multiple Vitamins-Minerals (PRESERVISION AREDS 2) CAPS Take 1 capsule by mouth 2 (two) times daily.   Yes [provider]  simvastatin  (ZOCOR ) 20 MG tablet TAKE 1 TABLET BY MOUTH DAILY 01/25/24  Yes Boyce Byes, MD    Past Medical History:  Diagnosis Date   Atrial fibrillation [facility])    Hypercholesterolemia    Hypertension    Overactive bladder    Past Surgical History:  Procedure Laterality Date   CARDIOVERSION N/A 12/17/2020   Procedure: CARDIOVERSION;  Surgeon: Jacqueline Matsu, MD;  Location: [facility] ENDOSCOPY;  Service: Cardiovascular;  Laterality: N/A;   CARDIOVERSION N/A 03/05/2024   Procedure: CARDIOVERSION;  Surgeon: Darlis Eisenmenger, MD;  Location: [facility] INVASIVE CV LAB;  Service: Cardiovascular;  Laterality: N/A;   Social History:  reports that she has never smoked. She has never used smokeless tobacco. No history on file for alcohol use and drug use. No Known Allergies Family History  Problem Relation Age of Onset   Heart failure Mother    COPD Father    CAD Brother    Diabetes Maternal Grandmother    Diabetes Brother    Physical Exam: Vitals:   03/08/24 1445 03/08/24 1500 03/08/24 1508 03/08/24 1617  BP: (!) 110/54 (!) 122/54   99/85  Pulse: (!) 55 (!) 54 (!) 57 (!) 58  Resp: (!) 25 19 18 18   Temp:   97.8 F (36.6 C) 98.7 F (37.1 C)  TempSrc:    Oral  SpO2: 94% 91% 91% 100%  Weight:    77.3 kg  Height:    5' 2 (1.575 m)   General: Appear in mild distress; no visible Abnormal Neck Mass Or lumps, Conjunctiva normal Cardiovascular: S1 and S2 Present, faint aortic systolic  Murmur, Respiratory: good respiratory effort, Bilateral Air entry present and bilateral  Crackles, no wheezes Abdomen: Bowel Sound present, Non tender  Extremities: left Pedal edema Neurology: alert and oriented to time, place, and person Gait not checked due to patient safety concerns   Data Reviewed: I have reviewed ED notes, Vitals, Lab results and outpatient records. Since last encounter, pertinent lab results CBC and BMP   . I have ordered test including CBC and BMP  . I have independently visualized and interpreted imaging CT chest which showed vascular congestion.  Aaron Aas  Family Communication: No one at bedside  Author: Charlean Congress, MD 03/08/2024 7:19 PM For on call review [email or web address].

## 2024-03-08 NOTE — Plan of Care (Signed)

## 2024-03-08 NOTE — Plan of Care (Signed)
 Hospital Medicine Transfer Accept Note Patient Name/Age: Kristin Hancock / 89 y.o. MRN: [id number] Admission Date: 03/08/2024  Once successfully transferred to the appropriate floor, TRH will assume care for the patient above.  A/P: 45F h/o Afib s/p recent cardioversion on Wed 6/11, and HTN p/w SHOB and c/f new ADHF. CTA PE protocol negative, but did show pulmonary vascular congestion c/w ADHF. BNP 3665. EDP gave IV lasix 20mg  x1. Admit to medicine order placed.

## 2024-03-08 NOTE — ED Provider Notes (Signed)
 [facility] [facility]GENCY DEPARTMENT AT [facility] Provider Note   CSN: [id number] Arrival date & time: 03/08/24  1104     Patient presents with: Shortness of Breath   Kristin Hancock is a 89 y.o. female.    Shortness of Breath    89 year old female with medical history significant for atrial fibrillation on Eliquis , HTN, HLD, overactive bladder, recent diagnosis of heart failure with recent echocardiogram showing an ejection fraction of 45% presenting to the emergency department with a chief complaint of dyspnea.  Patient states that she underwent cardioversion on Wednesday.  She has been prescribed amiodarone  as she was taken off flecainide  due to reduced ejection fraction.  Since her cardioversion, she has had increased generalized fatigue and weakness with associated dyspnea on exertion.  She endorses orthopnea.  She has had bilateral leg swelling.  She states that her leg swelling is asymmetric with the left leg increasingly more swollen than the right.  She denies any chest pain.  She has been compliant with her Eliquis .  She states that she is on hydrochlorothiazide , does not take any furosemide, has only missed 1 dose of hydrochlorothiazide  prior to her cardioversion.  Prior to Admission medications   Medication Sig Start Date End Date Taking? Authorizing Provider  amiodarone  (PACERONE ) 200 MG tablet Take 1 tablet (200 mg total) by mouth daily. SPECIAL INSTRUCTIONS: FOR 1 WEEK 400 MG TWICE A DAY  FOR 2 WEEKS 200 MG TWICE A DAY THEN TAKE 200 MG  DAILY 03/04/24   Debbie Fails, PA-C  amLODipine  (NORVASC ) 5 MG tablet Take 1 tablet (5 mg total) by mouth daily. 12/19/23   Boyce Byes, MD  atenolol  (TENORMIN ) 50 MG tablet Take 1 tablet (50 mg total) by mouth daily. 12/19/23   Boyce Byes, MD  Avacincaptad Pegol (IZERVAY) 2 MG/0.1ML SOLN 1 mg by Intravitreal route every 2 (two) months.    [provider]  Calcium Carb-Cholecalciferol (CALCIUM 600+D) 600-20  MG-MCG TABS Take 1 tablet by mouth daily.    [provider]  ELIQUIS  5 MG TABS tablet TAKE 1 TABLET BY MOUTH TWICE  DAILY 10/22/23   Boyce Byes, MD  ferrous sulfate 325 (65 FE) MG tablet Take 325 mg by mouth daily.    [provider]  hydrochlorothiazide  (HYDRODIURIL ) 12.5 MG tablet TAKE 1 TABLET BY MOUTH DAILY 01/25/24   Boyce Byes, MD  losartan  (COZAAR ) 100 MG tablet Take 1 tablet (100 mg total) by mouth daily. 12/19/23   Boyce Byes, MD  Multiple Vitamins-Minerals (PRESERVISION AREDS 2) CAPS Take 1 capsule by mouth 2 (two) times daily.    [provider]  simvastatin  (ZOCOR ) 20 MG tablet TAKE 1 TABLET BY MOUTH DAILY 01/25/24   Boyce Byes, MD    Allergies: Patient has no known allergies.    Review of Systems  Respiratory:  Positive for shortness of breath.   All other systems reviewed and are negative.   Updated Vital Signs BP 123/65   Pulse (!) 58   Temp 98.2 F (36.8 C) (Oral)   Resp 18   LMP  (LMP Unknown)   SpO2 96%   Physical Exam Vitals and nursing note reviewed.  Constitutional:      General: She is not in acute distress.    Appearance: She is well-developed.  HENT:     Head: Normocephalic and atraumatic.   Eyes:     Conjunctiva/sclera: Conjunctivae normal.    Cardiovascular:     Rate and Rhythm: Normal  rate and regular rhythm.     Heart sounds: No murmur heard. Pulmonary:     Effort: Pulmonary effort is normal. No respiratory distress.     Breath sounds: Examination of the right-lower field reveals rales. Examination of the left-lower field reveals rales. Rales present.  Abdominal:     Palpations: Abdomen is soft.     Tenderness: There is no abdominal tenderness.   Musculoskeletal:        General: No swelling.     Cervical back: Neck supple.     Right lower leg: Edema present.     Left lower leg: Edema present.     Comments: Asymmetric pitting edema, left greater than right   Skin:    General: Skin  is warm and dry.     Capillary Refill: Capillary refill takes less than 2 seconds.   Neurological:     Mental Status: She is alert.   Psychiatric:        Mood and Affect: Mood normal.     (all labs ordered are listed, but only abnormal results are displayed) Labs Reviewed  BASIC METABOLIC PANEL WITH GFR - Abnormal; Notable for the following components:      Result Value   CO2 20 (*)    Glucose, Bld 112 (*)    BUN 46 (*)    Creatinine, Ser 1.31 (*)    GFR, Estimated 38 (*)    All other components within normal limits  CBC - Abnormal; Notable for the following components:   Hemoglobin 11.1 (*)    HCT 34.5 (*)    RDW 16.7 (*)    All other components within normal limits  PRO BRAIN NATRIURETIC PEPTIDE - Abnormal; Notable for the following components:   Pro Brain Natriuretic Peptide 3,665.0 (*)    All other components within normal limits  TROPONIN T, HIGH SENSITIVITY - Abnormal; Notable for the following components:   Troponin T High Sensitivity 24 (*)    All other components within normal limits    EKG: EKG Interpretation Date/Time:  Saturday March 08 2024 11:20:11 EDT Ventricular Rate:  58 PR Interval:  155 QRS Duration:  102 QT Interval:  481 QTC Calculation: 473 R Axis:   59  Text Interpretation: Sinus bradycardia Low voltage, precordial leads Confirmed by Rosealee Concha ([id number]) on 03/08/2024 11:45:15 AM  Radiology: US  Venous Img Lower Unilateral Left Result Date: 03/08/2024 CLINICAL DATA:  Left leg swelling, short of breath, recent cardioversion EXAM: LEFT LOWER EXTREMITY VENOUS DOPPLER ULTRASOUND TECHNIQUE: Gray-scale sonography with compression, as well as color and duplex ultrasound, were performed to evaluate the deep venous system(s) from the level of the common femoral vein through the popliteal and proximal calf veins. COMPARISON:  None Available. FINDINGS: VENOUS Normal compressibility of the common femoral, superficial femoral, and popliteal veins, as well as the  visualized calf veins. Visualized portions of profunda femoral vein and great saphenous vein unremarkable. No filling defects to suggest DVT on grayscale or color Doppler imaging. Doppler waveforms show normal direction of venous flow, normal respiratory plasticity and response to augmentation. Limited views of the contralateral common femoral vein are unremarkable. OTHER None. Limitations: none IMPRESSION: 1. No evidence of deep venous thrombosis within the left lower extremity. Electronically Signed   By: Bobbye Burrow M.D.   On: 03/08/2024 13:53   CT Angio Chest PE W and/or Wo Contrast Result Date: 03/08/2024 CLINICAL DATA:  Weakness, short of breath, recent cardioversion EXAM: CT ANGIOGRAPHY CHEST WITH CONTRAST TECHNIQUE: Multidetector CT imaging of  the chest was performed using the standard protocol during bolus administration of intravenous contrast. Multiplanar CT image reconstructions and MIPs were obtained to evaluate the vascular anatomy. RADIATION DOSE REDUCTION: This exam was performed according to the departmental dose-optimization program which includes automated exposure control, adjustment of the mA and/or kV according to patient size and/or use of iterative reconstruction technique. CONTRAST:  75mL OMNIPAQUE IOHEXOL 350 MG/ML SOLN COMPARISON:  None Available. FINDINGS: Cardiovascular: This is a technically adequate evaluation of the pulmonary vasculature. No filling defects or pulmonary emboli. Mild cardiomegaly without pericardial effusion. Calcifications of the mitral and aortic valve. Normal caliber of the thoracic aorta. Atherosclerosis of the aorta and coronary vasculature. Mediastinum/Nodes: Mediastinal lymphadenopathy, measuring 17 mm in the precarinal region image 55/4, and 17 mm in the subcarinal region image 73/4. No hilar or axillary adenopathy. Thyroid , trachea, and esophagus appear unremarkable. Lungs/Pleura: There are small bilateral pleural effusions, right greater than left. Mild  diffuse interlobular septal thickening, greatest at the lung bases, consistent with interstitial edema. No acute airspace disease or pneumothorax. Central airways are patent. Upper Abdomen: Partial visualization of calcified gallstones dependent within the gallbladder. No evidence of acute cholecystitis. Musculoskeletal: No acute or destructive bony abnormalities. Reconstructed images demonstrate no additional findings. Review of the MIP images confirms the above findings. IMPRESSION: 1. No evidence of pulmonary embolus. 2. Cardiomegaly, small bilateral pleural effusions, and interlobular septal thickening most consistent with mild congestive heart failure and developing interstitial edema. 3. Nonspecific mediastinal lymphadenopathy, measuring up to 17 mm as above. 4. Incidental cholelithiasis without evidence of cholecystitis. 5. Aortic Atherosclerosis (ICD10-I70.0). Coronary artery atherosclerosis. Electronically Signed   By: Bobbye Burrow M.D.   On: 03/08/2024 13:35     Procedures   Medications Ordered in the ED  iohexol (OMNIPAQUE) 350 MG/ML injection 75 mL (75 mLs Intravenous Contrast Given 03/08/24 1243)  furosemide (LASIX) injection 20 mg (20 mg Intravenous Given 03/08/24 1310)    Clinical Course as of 03/08/24 1403  Sat Mar 08, 2024  1333 Pro Brain Natriuretic Peptide(!): 3,665.0 [JL]  1333 Troponin T High Sensitivity(!): 24 [JL]  1333 Creatinine(!): 1.31 [JL]    Clinical Course User Index [JL] Rosealee Concha, MD                                 Medical Decision Making Amount and/or Complexity of Data Reviewed Labs: ordered. Decision-making details documented in ED Course. Radiology: ordered.  Risk Prescription drug management. Decision regarding hospitalization.     89 year old female with medical history significant for atrial fibrillation on Eliquis , HTN, HLD, overactive bladder, recent diagnosis of heart failure with recent echocardiogram showing an ejection fraction of 45%  presenting to the emergency department with a chief complaint of dyspnea.  Patient states that she underwent cardioversion on Wednesday.  She has been prescribed amiodarone  as she was taken off flecainide  due to reduced ejection fraction.  Since her cardioversion, she has had increased generalized fatigue and weakness with associated dyspnea on exertion.  She endorses orthopnea.  She has had bilateral leg swelling.  She states that her leg swelling is asymmetric with the left leg increasingly more swollen than the right.  She denies any chest pain.  She has been compliant with her Eliquis .  She states that she is on hydrochlorothiazide , does not take any furosemide, has only missed 1 dose of hydrochlorothiazide  prior to her cardioversion.  On arrival, the patient was afebrile, bradycardic heart rate 55,  not tachypneic RR 17, BP 129/55, saturating 94% on room air.  Discal exam revealed asymmetric pitting edema in the lower extremities, left greater than right, Rales at the bases of the lungs.  Patient presenting with signs and symptoms consistent with likely CHF exacerbation.  Not on furosemide outpatient, only on hydrochlorothiazide .  20 mg of IV Lasix was administered.  Laboratory evaluation revealed cardiac troponin 24 (patient without chest pain, low concern for ACS), BNP 3665.  CBC without leukocytosis, mild anemia to 11.1, BMP with an AKI with a creatinine of 1.31 from a baseline of 0.8-0.9.  DVT ultrasound: Negative for DVT  CTA PE: IMPRESSION:  1. No evidence of pulmonary embolus.  2. Cardiomegaly, small bilateral pleural effusions, and interlobular  septal thickening most consistent with mild congestive heart failure  and developing interstitial edema.  3. Nonspecific mediastinal lymphadenopathy, measuring up to 17 mm as  above.  4. Incidental cholelithiasis without evidence of cholecystitis.  5. Aortic Atherosclerosis (ICD10-I70.0). Coronary artery  atherosclerosis.   Patient was  developing pulmonary edema, low oxygen saturations at rest, presenting with orthopnea, lower extremity edema, negative for PE, negative for DVT, findings concerning for acute CHF exacerbation and AKI, recommended admission for observation and continued diuresis, hospitalist medicine consulted for admission, Dr. Sulema Endo accepting.     Final diagnoses:  Acute on chronic systolic congestive heart failure (HCC)  AKI (acute kidney injury) [facility])    ED Discharge Orders     None          Rosealee Concha, MD 03/08/24 1431

## 2024-03-08 NOTE — Telephone Encounter (Signed)
 Daughter, Jerrold Morgan called.  She is on DPR.  89 yo female w hx of atrial fibrillation.  She was recently taken off of flecainide  secondary to reduced LV function with EF 45-50.  She had atrial flutter and was set up for cardioversion on 03/05/2024.  Since then, she has noted heart rates in the 50s to 60s, increasing shortness of breath as well as orthopnea.  She does not weigh herself.  She notes shortness of breath just at rest.  She seems to be describing symptoms of acute CHF.  I have recommended that she go to the emergency room for further evaluation and management.   Daughter agreed with this plan. Marlyse Single, PA-C 03/08/2024 10:03 AM

## 2024-03-08 NOTE — Hospital Course (Signed)
 Patient with PMH of persistent A-fib, chronic systolic CHF, HTN, HLD, obesity presented to the hospital with complaints of progressively worsening shortness of breath. Patient has known history of A-fib was on flecainide  in the past.  Recent echocardiogram performed for her complaints of shortness of breath showed an EF of 45%.  She was asked to stop the flecainide .  Presented to the hospital on 6/11 for cardioversion. Discharged home on amiodarone . After going home she continues to have shortness of breath after discharge and also had some episodes of dizziness. No chest pain no chest tightness reported by the patient.  No nausea no vomiting or no diarrhea no constipation.  No other focal deficit. Patient reports that she is regimented about her medication compliance. She denies any other change in her medication. At home blood pressure has been running in 70s systolic and 80s systolic. Heart rate has been in 50s and 60s. She called the on-call cardiology doctor who recommended her to come to the [facility] and from where she was brought to the hospital for treatment. She denies any orthopnea or PND. She has chronic swelling of her left leg including swelling of her ankle without any worsening. She does not check her weight on a daily basis.  Assessment and Plan: Acute on chronic systolic CHF Recent echocardiogram as above showed EF of 45 to 50%. Mild MS, mild TR. Recently underwent cardioversion. Was taking atenolol  50 mg twice daily as prescribed. Received IV Lasix with improvement in renal function as well as her symptoms. Will continue with IV Lasix and recheck the renal function in the morning. Being gentle with the diuresis given no need for oxygenation as well as elevated renal function and patient receiving IV contrast for CT PE protocol. Cardiology consult recommended although will perform in the morning as it will not change management at present.  Persistent atrial  fibrillation  Patient is on amiodarone  and Eliquis . Currently undergoing amiodarone  load current doses 400 mg twice daily.  Starting 6/18 we will be reducing it to 200 mg twice daily for 2 weeks followed by 200 mg daily. Given the recency of the amiodarone  use suspect that the amiodarone  induced pulmonary toxicity is less likely although will check an ESR and CRP.  HTN  Blood pressure is very soft. Patient has had family gram amlodipine , 50 mg atenolol , 12.5 mg HCTZ and 100 mg Cozaar . Currently I will be holding all medications. May need to be on GDMT for her systolic dysfunction. Will defer to cardiology in the morning.  HLD  On statin. Monitor.  AKI Likely cardiorenal hemodynamics. Recent serum creatinine 0.9 on 6/10. Serum creatinine raised to 1.31 on 6/14. Improving after diuresis to 1.1. Monitor closely.  Goals of care conversation. Discussed in details with regards to her goals of care. Wants to be full code for now. Wants her daughter to be her HCPOA.

## 2024-03-09 ENCOUNTER — Inpatient Hospital Stay (HOSPITAL_COMMUNITY)

## 2024-03-09 DIAGNOSIS — I5023 Acute on chronic systolic (congestive) heart failure: Secondary | ICD-10-CM | POA: Diagnosis not present

## 2024-03-09 DIAGNOSIS — I5022 Chronic systolic (congestive) heart failure: Secondary | ICD-10-CM

## 2024-03-09 LAB — BASIC METABOLIC PANEL WITH GFR
Anion gap: 12 (ref 5–15)
Anion gap: 13 (ref 5–15)
BUN: 42 mg/dL — ABNORMAL HIGH (ref 8–23)
BUN: 38 mg/dL — ABNORMAL HIGH (ref 8–23)
CO2: 24 mmol/L (ref 22–32)
CO2: 24 mmol/L (ref 22–32)
Calcium: 9.2 mg/dL (ref 8.9–10.3)
Calcium: 9 mg/dL (ref 8.9–10.3)
Chloride: 105 mmol/L (ref 98–111)
Chloride: 101 mmol/L (ref 98–111)
Creatinine, Ser: 1.26 mg/dL — ABNORMAL HIGH (ref 0.44–1.00)
Creatinine, Ser: 1.31 mg/dL — ABNORMAL HIGH (ref 0.44–1.00)
GFR, Estimated: 40 mL/min — ABNORMAL LOW (ref >60)
GFR, Estimated: 38 mL/min — ABNORMAL LOW (ref >60)
Glucose, Bld: 122 mg/dL — ABNORMAL HIGH (ref 70–99)
Glucose, Bld: 153 mg/dL — ABNORMAL HIGH (ref 70–99)
Potassium: 4.2 mmol/L (ref 3.5–5.1)
Potassium: 3.9 mmol/L (ref 3.5–5.1)
Sodium: 141 mmol/L (ref 135–145)
Sodium: 138 mmol/L (ref 135–145)

## 2024-03-09 LAB — CBC
HCT: 36.5 % (ref 36.0–46.0)
Hemoglobin: 11.9 g/dL — ABNORMAL LOW (ref 12.0–15.0)
MCH: 27.4 pg (ref 26.0–34.0)
MCHC: 32.6 g/dL (ref 30.0–36.0)
MCV: 84.1 fL (ref 80.0–100.0)
Platelets: 189 10*3/uL (ref 150–400)
RBC: 4.34 MIL/uL (ref 3.87–5.11)
RDW: 16.5 % — ABNORMAL HIGH (ref 11.5–15.5)
WBC: 14.5 10*3/uL — ABNORMAL HIGH (ref 4.0–10.5)
nRBC: 0 % (ref 0.0–0.2)

## 2024-03-09 LAB — URINALYSIS, COMPLETE (UACMP) WITH MICROSCOPIC
Bilirubin Urine: NEGATIVE
Glucose, UA: NEGATIVE mg/dL
Ketones, ur: NEGATIVE mg/dL
Nitrite: NEGATIVE
Protein, ur: NEGATIVE mg/dL
RBC / HPF: >50 RBC/hpf (ref 0–5)
Specific Gravity, Urine: 1.01 (ref 1.005–1.030)
WBC, UA: >50 WBC/hpf (ref 0–5)
pH: 6 (ref 5.0–8.0)

## 2024-03-09 LAB — C-REACTIVE PROTEIN: CRP: 0.9 mg/dL (ref <1.0)

## 2024-03-09 LAB — PROCALCITONIN: Procalcitonin: <0.1 ng/mL

## 2024-03-09 LAB — SODIUM, URINE, RANDOM: Sodium, Ur: 105 mmol/L

## 2024-03-09 LAB — CREATININE, URINE, RANDOM: Creatinine, Urine: 31 mg/dL

## 2024-03-09 MED ORDER — FUROSEMIDE 10 MG/ML IJ SOLN
40.0000 mg | Freq: Two times a day (BID) | INTRAMUSCULAR | Status: AC
  Administered 2024-03-09 (×2): 40 mg via INTRAVENOUS
  Filled 2024-03-09: qty 4

## 2024-03-09 MED ORDER — FUROSEMIDE 10 MG/ML IJ SOLN
40.0000 mg | Freq: Two times a day (BID) | INTRAMUSCULAR | Status: DC
  Filled 2024-03-09: qty 4

## 2024-03-09 MED ORDER — FUROSEMIDE 40 MG PO TABS
40.0000 mg | ORAL_TABLET | Freq: Every day | ORAL | Status: DC
  Administered 2024-03-10: 40 mg via ORAL
  Filled 2024-03-09: qty 1

## 2024-03-09 MED ORDER — MELATONIN 5 MG PO TABS
5.0000 mg | ORAL_TABLET | Freq: Every day | ORAL | Status: DC
  Administered 2024-03-09 – 2024-03-12 (×4): 5 mg via ORAL
  Filled 2024-03-09 (×4): qty 1

## 2024-03-09 NOTE — Progress Notes (Signed)
 PT Cancellation Note  Patient Details Name: Kristin Hancock MRN: [id number] DOB: 08/23/1931   Cancelled Treatment:    Reason Eval/Treat Not Completed: Other (comment) Pt ambulated earlier with mobility specialist and again to restroom.  Reports she is now on lasix and constantly urinating.  Declined PT this afternoon.  Will f/u at later date.  Cyd Dowse, PT Acute Rehab [facility] [phone]   Carolynn Citrin 03/09/2024, 2:02 PM

## 2024-03-09 NOTE — Progress Notes (Signed)
 Mobility Specialist Progress Note:   03/09/24 0945  Mobility  Activity Ambulated with assistance in hallway  Level of Assistance Contact guard assist, steadying assist  Assistive Device Front wheel walker  Distance Ambulated (ft) 180 ft  Activity Response Tolerated well  Mobility Referral Yes  Mobility visit 1 Mobility  Mobility Specialist Start Time (ACUTE ONLY) 0945  Mobility Specialist Stop Time (ACUTE ONLY) 1000  Mobility Specialist Time Calculation (min) (ACUTE ONLY) 15 min   Pt agreeable to mobility session. Required only minG assist to ambulate with RW. VSS on RA with one dip to 89%, otherwise maintained low-mid 90s. Pt agreeable to sit in chair. Left with all needs met, on RA, SpO2 94%. Daughter present.  Oneda Big Mobility Specialist Please contact via SecureChat or  Rehab office at [phone]

## 2024-03-09 NOTE — Progress Notes (Signed)
 Pt states she feels like she is getting an infection. She is shaking in the room and says she has chills. She says she has felt like this before when she had UTI. Warm blankets provided to pt. BP (!) 129/56 (BP Location: Right Arm)   Pulse 61   Temp 98.6 F (37 C) (Oral)   Resp 18   Ht 5' 2 (1.575 m)   Wt 75.2 kg   LMP  (LMP Unknown)   SpO2 96%   BMI 30.32 kg/m   Dr. Lydia Sams updated.

## 2024-03-09 NOTE — Plan of Care (Signed)

## 2024-03-09 NOTE — Consult Note (Addendum)
 Cardiology Consultation   Patient ID: Kristin Hancock MRN: [id number]; DOB: 1931-07-25  Admit date: 03/08/2024 Date of Consult: 03/09/2024  PCP:  Elester Grim, MD   [facility] Providers Cardiologist:  None  Electrophysiologist:  Boyce Byes, MD       Patient Profile: Kristin Hancock is a 89 y.o. female with a hx of persistent A-fib s/p DCCV 03/05/2024 & 12/17/2020, chronic systolic CHF, HTN, HLD, obesity, who is being seen 03/09/2024 for the evaluation of CHF at the request of Dr Lydia Sams.  History of Present Illness: Kristin Hancock has been seeing Dr Marven Slimmer since 2022. At her initial visit, she was already on Flecainide  50 mg bid and was in rapid atrial fib. Flecainide  increased to 75 mg bid, and had DCCV.  She maintained SR for several years. She was struggling to cut the Flecainide , so at 06/29/2023 office visit, it was cut back to 50 mg bid.  Routine echo performed 01/2024 w/ EF 45-50% >> flecainide  d/c'd.  At 03/04/2024 office visit, she was in atrial flutter, HR129.  Amiodarone  was initiated.  S/p DCCV on 06/11 and tolerated well.  Symptoms included shortness of breath with minimal exertion and some mild edema.  Post-procedure, she developed more SOB and came to the ER. She was admitted for CHF, Cards asked to see.   Patient reports lower extremity edema that seem to get little worse after the cardioversion and her shortness of breath gradually worsened as well.  Finally she came into the ER.  In the ER, she got 2 doses of IV Lasix 20 mg 6 hours apart.  Total recorded urine output is 600 cc, but the family states there was more in the ER.  Her weight has decreased by 5 pounds overnight.  She is breathing a little better, but still requires oxygen which she was not on at home.  She notes that her legs now have no edema.  Her daughter is present, and multiple questions were answered regarding CHF treatment.   Past Medical History:  Diagnosis Date    Atrial fibrillation (HCC)    Hypercholesterolemia    Hypertension    Overactive bladder     Past Surgical History:  Procedure Laterality Date   CARDIOVERSION N/A 12/17/2020   Procedure: CARDIOVERSION;  Surgeon: Jacqueline Matsu, MD;  Location: [facility] ENDOSCOPY;  Service: Cardiovascular;  Laterality: N/A;   CARDIOVERSION N/A 03/05/2024   Procedure: CARDIOVERSION;  Surgeon: Darlis Eisenmenger, MD;  Location: [facility] INVASIVE CV LAB;  Service: Cardiovascular;  Laterality: N/A;     Home Medications:  Prior to Admission medications   Medication Sig Start Date End Date Taking? Authorizing Provider  acetaminophen (TYLENOL) 500 MG tablet Take 1,000 mg by mouth 2 (two) times daily as needed for moderate pain (pain score 4-6), fever or headache.   Yes [provider]  amiodarone  (PACERONE ) 200 MG tablet Take 1 tablet (200 mg total) by mouth daily. SPECIAL INSTRUCTIONS: FOR 1 WEEK 400 MG TWICE A DAY  FOR 2 WEEKS 200 MG TWICE A DAY THEN TAKE 200 MG  DAILY 03/04/24  Yes Debbie Fails, PA-C  amLODipine  (NORVASC ) 5 MG tablet Take 1 tablet (5 mg total) by mouth daily. 12/19/23  Yes Boyce Byes, MD  atenolol  (TENORMIN ) 50 MG tablet Take 1 tablet (50 mg total) by mouth daily. 12/19/23  Yes Boyce Byes, MD  Avacincaptad Pegol (IZERVAY) 2 MG/0.1ML SOLN 1 mg by Intravitreal route every 2 (two) months.   Yes [provider]  Calcium Carb-Cholecalciferol (CALCIUM + VITAMIN D3 PO) Take 1 tablet by mouth daily.   Yes [provider]  ELIQUIS  5 MG TABS tablet TAKE 1 TABLET BY MOUTH TWICE  DAILY 10/22/23  Yes Boyce Byes, MD  ferrous sulfate 325 (65 FE) MG tablet Take 325 mg by mouth daily.   Yes [provider]  hydrochlorothiazide  (HYDRODIURIL ) 12.5 MG tablet TAKE 1 TABLET BY MOUTH DAILY 01/25/24  Yes Boyce Byes, MD  losartan  (COZAAR ) 100 MG tablet Take 1 tablet (100 mg total) by mouth daily. 12/19/23  Yes Boyce Byes, MD  Multiple Vitamins-Minerals  (PRESERVISION AREDS 2) CAPS Take 1 capsule by mouth 2 (two) times daily.   Yes [provider]  simvastatin  (ZOCOR ) 20 MG tablet TAKE 1 TABLET BY MOUTH DAILY 01/25/24  Yes Boyce Byes, MD    Scheduled Meds:  amiodarone   400 mg Oral BID   Followed by   Cecily Cohen ON 03/12/2024] amiodarone   200 mg Oral BID   apixaban   5 mg Oral BID   ferrous sulfate  325 mg Oral Daily   simvastatin   20 mg Oral Daily   Continuous Infusions:  PRN Meds: acetaminophen **OR** acetaminophen, metoprolol tartrate, ondansetron **OR** ondansetron (ZOFRAN) IV  Allergies:   No Known Allergies  Social History:   Social History   Socioeconomic History   Marital status: Married    Spouse name: Not on file   Number of children: Not on file   Years of education: Not on file   Highest education level: Not on file  Occupational History   Not on file  Tobacco Use   Smoking status: Never   Smokeless tobacco: Never  Substance and Sexual Activity   Alcohol use: Not on file   Drug use: Not on file   Sexual activity: Not on file  Other Topics Concern   Not on file  Social History Narrative   Not on file   Social Drivers of Health   Financial Resource Strain: High Risk (01/07/2020)   Received from [facility]   Financial Resource Strain    Difficulty Paying Living Expenses: Not hard at all    Difficulty Paying Medical Expenses: Yes  Food Insecurity: No Food Insecurity (03/08/2024)   Hunger Vital Sign    Worried About Running Out of Food in the Last Year: Never true    Ran Out of Food in the Last Year: Never true  Transportation Needs: No Transportation Needs (03/08/2024)   PRAPARE - Administrator, Civil Service (Medical): No    Lack of Transportation (Non-Medical): No  Physical Activity: Insufficiently Active (01/07/2020)   Received from [facility]   Physical Activity    Days of Exercise per Week: 3 days    Minutes of Exercise per Session: 30    Total Minutes of Exercise per  Week: 90  Stress: No Stress Concern Present (01/07/2020)   Received from [facility]   Stress    Feeling of Stress : Not at all  Social Connections: Moderately Integrated (03/08/2024)   Social Connection and Isolation Panel    Frequency of Communication with Friends and Family: More than three times a week    Frequency of Social Gatherings with Friends and Family: More than three times a week    Attends Religious Services: More than 4 times per year    Active Member of [employer] or Organizations: Yes    Attends Banker Meetings: More than 4 times per year  Marital Status: Widowed  Intimate Partner Violence: Not At Risk (03/08/2024)   Humiliation, Afraid, Rape, and Kick questionnaire    Fear of Current or Ex-Partner: No    Emotionally Abused: No    Physically Abused: No    Sexually Abused: No    Family History:  Family History  Problem Relation Age of Onset   Heart failure Mother    COPD Father    CAD Brother    Diabetes Maternal Grandmother    Diabetes Brother      ROS:  Please see the history of present illness.  All other ROS reviewed and negative.     Physical Exam/Data: Vitals:   03/08/24 1918 03/08/24 2359 03/09/24 0404 03/09/24 0636  BP: (!) 104/48 (!) 122/59 137/66   Pulse: 63 61 71   Resp: 17 17 18    Temp: 97.7 F (36.5 C) 98 F (36.7 C) 98.7 F (37.1 C)   TempSrc: Oral Oral Oral   SpO2: 93% 91% 96%   Weight:    75.2 kg  Height:        Intake/Output Summary (Last 24 hours) at 03/09/2024 0753 Last data filed at 03/09/2024 0406 Gross per 24 hour  Intake --  Output 600 ml  Net -600 ml      03/09/2024    6:36 AM 03/08/2024    4:17 PM 03/04/2024    9:03 AM  Last 3 Weights  Weight (lbs) 165 lb 12.6 oz 170 lb 6.7 oz 171 lb  Weight (kg) 75.2 kg 77.3 kg 77.565 kg     Body mass index is 30.32 kg/m.  General:  Well nourished, well developed, elderly female in no acute distress on O2 HEENT: normal Neck: JVD 12 cm Vascular: No carotid bruits;  Distal pulses 2+ bilaterally Cardiac:  normal S1, S2; RRR; soft murmur  Lungs: Decreased breath sounds bases with a few Rales bilaterally, no wheezing, rhonchi  Abd: soft, nontender, no hepatomegaly  Ext: no edema Musculoskeletal:  No deformities, BUE and BLE strength normal and equal Skin: warm and dry  Neuro:  CNs 2-12 intact, no focal abnormalities noted Psych:  Normal affect   EKG:  The EKG was personally reviewed and demonstrates:  06/14 ECG is sinus brady, HR 54, low voltage (similar to previous) Telemetry:  Telemetry was personally reviewed and demonstrates: Sinus rhythm, sinus bradycardia  Relevant CV Studies:  ECHO: 01/29/2024  1. Left ventricular ejection fraction, by estimation, is 45 to 50%. The  left ventricle has mildly decreased function. The left ventricle  demonstrates global hypokinesis. Left ventricular diastolic function could not be evaluated.   2. Right ventricular systolic function is mildly reduced. The right  ventricular size is normal. There is moderately elevated pulmonary artery systolic pressure. The estimated right ventricular systolic pressure is 49.0 mmHg.   3. Left atrial size was mild to moderately dilated.   4. The mitral valve is degenerative. Trivial mitral valve regurgitation.  Mild mitral stenosis. The mean mitral valve gradient is 6.3 mmHg with average heart rate of 99 bpm. Moderate mitral annular calcification.   5. Tricuspid valve regurgitation is mild to moderate.   6. The aortic valve is tricuspid. There is mild calcification of the  aortic valve. There is mild thickening of the aortic valve. Aortic valve  regurgitation is not visualized. Aortic valve sclerosis/calcification is  present, without any evidence of aortic stenosis.   7. The inferior vena cava is normal in size with greater than 50%  respiratory variability, suggesting right atrial pressure  of 3 mmHg.    Laboratory Data: High Sensitivity Troponin:  No results for input(s):  TROPONINIHS in the last 720 hours.   Chemistry Recent Labs  Lab 03/08/24 1111 03/08/24 1754 03/09/24 0246  NA 141 141 141  K 4.6 3.8 4.2  CL 106 107 105  CO2 20* 23 24  GLUCOSE 112* 106* 122*  BUN 46* 42* 42*  CREATININE 1.31* 1.14* 1.26*  CALCIUM 9.6 9.3 9.2  MG  --  1.8  --   GFRNONAA 38* 45* 40*  ANIONGAP 15 11 12     Recent Labs  Lab 03/04/24 1025  PROT 7.4  ALBUMIN 4.0  AST 20  ALT 13  ALKPHOS 84  BILITOT 0.3   Lipids No results for input(s): CHOL, TRIG, HDL, LABVLDL, LDLCALC, CHOLHDL in the last 168 hours.  Hematology Recent Labs  Lab 03/04/24 1025 03/08/24 1111 03/09/24 0246  WBC 9.9 7.9 14.5*  RBC 4.46 4.09 4.34  HGB 12.2 11.1* 11.9*  HCT 39.3 34.5* 36.5  MCV 88 84.4 84.1  MCH 27.4 27.1 27.4  MCHC 31.0* 32.2 32.6  RDW 15.7* 16.7* 16.5*  PLT 206 182 189   Thyroid   Recent Labs  Lab 03/08/24 1754  TSH 2.665  FREET4 1.13*    BNP Recent Labs  Lab 03/08/24 1237  PROBNP 3,665.0*    DDimer No results for input(s): DDIMER in the last 168 hours.  Radiology/Studies:  US  Venous Img Lower Unilateral Left Result Date: 03/08/2024 CLINICAL DATA:  Left leg swelling, short of breath, recent cardioversion EXAM: LEFT LOWER EXTREMITY VENOUS DOPPLER ULTRASOUND TECHNIQUE: Gray-scale sonography with compression, as well as color and duplex ultrasound, were performed to evaluate the deep venous system(s) from the level of the common femoral vein through the popliteal and proximal calf veins. COMPARISON:  None Available. FINDINGS: VENOUS Normal compressibility of the common femoral, superficial femoral, and popliteal veins, as well as the visualized calf veins. Visualized portions of profunda femoral vein and great saphenous vein unremarkable. No filling defects to suggest DVT on grayscale or color Doppler imaging. Doppler waveforms show normal direction of venous flow, normal respiratory plasticity and response to augmentation. Limited views of the  contralateral common femoral vein are unremarkable. OTHER None. Limitations: none IMPRESSION: 1. No evidence of deep venous thrombosis within the left lower extremity. Electronically Signed   By: Bobbye Burrow M.D.   On: 03/08/2024 13:53   CT Angio Chest PE W and/or Wo Contrast Result Date: 03/08/2024 CLINICAL DATA:  Weakness, short of breath, recent cardioversion EXAM: CT ANGIOGRAPHY CHEST WITH CONTRAST TECHNIQUE: Multidetector CT imaging of the chest was performed using the standard protocol during bolus administration of intravenous contrast. Multiplanar CT image reconstructions and MIPs were obtained to evaluate the vascular anatomy. RADIATION DOSE REDUCTION: This exam was performed according to the departmental dose-optimization program which includes automated exposure control, adjustment of the mA and/or kV according to patient size and/or use of iterative reconstruction technique. CONTRAST:  75mL OMNIPAQUE IOHEXOL 350 MG/ML SOLN COMPARISON:  None Available. FINDINGS: Cardiovascular: This is a technically adequate evaluation of the pulmonary vasculature. No filling defects or pulmonary emboli. Mild cardiomegaly without pericardial effusion. Calcifications of the mitral and aortic valve. Normal caliber of the thoracic aorta. Atherosclerosis of the aorta and coronary vasculature. Mediastinum/Nodes: Mediastinal lymphadenopathy, measuring 17 mm in the precarinal region image 55/4, and 17 mm in the subcarinal region image 73/4. No hilar or axillary adenopathy. Thyroid , trachea, and esophagus appear unremarkable. Lungs/Pleura: There are small bilateral pleural effusions, right greater than  left. Mild diffuse interlobular septal thickening, greatest at the lung bases, consistent with interstitial edema. No acute airspace disease or pneumothorax. Central airways are patent. Upper Abdomen: Partial visualization of calcified gallstones dependent within the gallbladder. No evidence of acute cholecystitis.  Musculoskeletal: No acute or destructive bony abnormalities. Reconstructed images demonstrate no additional findings. Review of the MIP images confirms the above findings. IMPRESSION: 1. No evidence of pulmonary embolus. 2. Cardiomegaly, small bilateral pleural effusions, and interlobular septal thickening most consistent with mild congestive heart failure and developing interstitial edema. 3. Nonspecific mediastinal lymphadenopathy, measuring up to 17 mm as above. 4. Incidental cholelithiasis without evidence of cholecystitis. 5. Aortic Atherosclerosis (ICD10-I70.0). Coronary artery atherosclerosis. Electronically Signed   By: Bobbye Burrow M.D.   On: 03/08/2024 13:35   EP STUDY Result Date: 03/05/2024 See surgical note for result.    Assessment and Plan: Acute CHF with a slightly reduced ejection fraction - Prior to the cardioversion, she may have been slightly volume overloaded but because of the atrial flutter was not aware of it, as her symptoms were felt secondary to the flutter -On arrival, she had a chest CT with contrast which was negative for PE but showed congestive heart failure and developing interstitial edema -BNP was significantly elevated 3665 - She had 2 dose of Lasix 20 mg about 6 hours apart with reasonable urine output, but not great - Will start Lasix 40 mg IV twice daily - Slight creatinine bump after the IV Lasix yesterday, but she was already above her baseline - She was not weighing herself at home, so her dry weight is unclear - Follow symptoms, the goal is to get enough fluid off to where she does not need oxygen at home  2.  Acute renal insufficiency - On 6/10, prior to her recent cardioversion, BUN was 38 and creatinine 0.94 - On admission, BUN/Cr  46/1.31 >>  42/1.14 (after 1st dose Lasix) >> 42/1.26 after second dose of Lasix - Will repeat BMET this afternoon to follow renal function and make sure she is tolerating the Lasix  3.  Atrial flutter - The reason  for the recent cardioversion was rapid atrial flutter, but she was previously in rapid atrial fibrillation - She was symptomatic with both, and rate control was unsuccessful -Flecainide  was discontinued because her EF had decreased -She was started on amiodarone  with a load prior to this most recent cardioversion - Continue amiodarone  and Eliquis  5 mg twice daily - She is maintaining sinus rhythm     Risk Assessment/Risk Scores:   New [place]  Heart Association (NYHA) Functional Class NYHA Class IV  CHA2DS2-VASc Score = 5   This indicates a 7.2% annual risk of stroke. The patient's score is based upon: CHF History: 1 HTN History: 1 Diabetes History: 0 Stroke History: 0 Vascular Disease History: 0 Age Score: 2 Gender Score: 1   For questions or updates, please contact [facility] Please consult [email or web address] for contact info under    Signed, Armandina Bernard, PA-C  03/09/2024 7:53 AM

## 2024-03-09 NOTE — Plan of Care (Signed)
   Problem: Clinical Measurements: Goal: Ability to maintain clinical measurements within normal limits will improve Outcome: Progressing Goal: Will remain free from infection Outcome: Progressing Goal: Diagnostic test results will improve Outcome: Progressing   Problem: Activity: Goal: Risk for activity intolerance will decrease Outcome: Progressing

## 2024-03-09 NOTE — Progress Notes (Signed)
 [facility] Progress Note Patient: Havannah Streat ONG:[id number] DOB: 09/29/1930 DOA: 03/08/2024  DOS: the patient was seen and examined on 03/09/2024  Brief Hospital Course: Patient with PMH of persistent A-fib, chronic systolic CHF, HTN, HLD, obesity presented to the hospital with complaints of progressively worsening shortness of breath. Currently being treated for CHF exacerbation.  Assessment and Plan: Acute on chronic systolic CHF Recent echocardiogram as above showed EF of 45 to 50%. Mild MS, mild TR. Recently underwent cardioversion. Was taking atenolol  50 mg twice daily as prescribed. Cardiology consulted Continue IV diuresis. Monitor BMP.  Persistent atrial fibrillation  Patient is on amiodarone  and Eliquis . Currently undergoing amiodarone  load current doses 400 mg twice daily.  Starting 6/18 we will be reducing it to 200 mg twice daily for 2 weeks followed by 200 mg daily. Given the recency of the amiodarone  use suspect that the amiodarone  induced pulmonary toxicity is less likely CRP normal. ESR mildly elevated.  Leukocytosis. Etiology not clear. Likely stress reaction. No fever no chills. UA unremarkable. Chest x-ray does not show any new infiltrate. Monitor.  No indication for antibiotics.  HTN  Blood pressure is very soft at home Patient has had family gram amlodipine , 50 mg atenolol , 12.5 mg HCTZ and 100 mg Cozaar . Currently I will be holding all medications. May need to be on GDMT for her systolic dysfunction. Will defer to cardiology  HLD  On statin. Monitor.  AKI Likely cardiorenal hemodynamics. Recent serum creatinine 0.9 on 6/10. Serum creatinine raised to 1.31 on 6/14. Improving after diuresis to 1.1. Monitor closely.  Goals of care conversation. Discussed in details with regards to her goals of care. Wants to be full code for now. Wants her daughter to be her HCPOA.   Subjective: No nausea no vomiting no fever no chills with no  chest pain.  Continues to have shortness of breath.   Physical Exam: General: in Mild distress, No Rash Cardiovascular: S1 and S2 Present, No Murmur Respiratory: Good respiratory effort, Bilateral Air entry present.  Bilateral crackles, No wheezes Abdomen: Bowel Sound present, No tenderness Extremities: Improving edema Neuro: Alert and oriented x3, no new focal deficit  Data Reviewed: I have Reviewed nursing notes, Vitals, and Lab results. Since last encounter, pertinent lab results CBC and BMP   . I have ordered test including CBC and BMP  . I have ordered imaging chest x-ray  .   Disposition: Status is: Inpatient Remains inpatient appropriate because: Monitor for improvement in renal function and improvement in respiratory symptoms  SCDs Start: 03/08/24 1715 apixaban  (ELIQUIS ) tablet 5 mg   Family Communication: Daughter at bedside Level of care: Telemetry Cardiac continue Vitals:   03/09/24 1045 03/09/24 1049 03/09/24 1523 03/09/24 1805  BP: (!) 106/46 (!) 100/49 (!) 129/56 (!) 130/58  Pulse: 61 61  77  Resp:   18   Temp:   98.6 F (37 C) 99.3 F (37.4 C)  TempSrc:   Oral Oral  SpO2: (!) 89% 97% 96% 97%  Weight:      Height:         Author: Charlean Congress, MD 03/09/2024 6:57 PM  Please look on [email or web address] to find out who is on call.

## 2024-03-10 ENCOUNTER — Ambulatory Visit: Admitting: Cardiology

## 2024-03-10 DIAGNOSIS — N179 Acute kidney failure, unspecified: Secondary | ICD-10-CM | POA: Diagnosis not present

## 2024-03-10 DIAGNOSIS — D72829 Elevated white blood cell count, unspecified: Secondary | ICD-10-CM

## 2024-03-10 DIAGNOSIS — I4819 Other persistent atrial fibrillation: Secondary | ICD-10-CM | POA: Diagnosis not present

## 2024-03-10 DIAGNOSIS — I5023 Acute on chronic systolic (congestive) heart failure: Secondary | ICD-10-CM | POA: Diagnosis not present

## 2024-03-10 DIAGNOSIS — E782 Mixed hyperlipidemia: Secondary | ICD-10-CM | POA: Diagnosis not present

## 2024-03-10 DIAGNOSIS — I130 Hypertensive heart and chronic kidney disease with heart failure and stage 1 through stage 4 chronic kidney disease, or unspecified chronic kidney disease: Secondary | ICD-10-CM

## 2024-03-10 DIAGNOSIS — I5021 Acute systolic (congestive) heart failure: Secondary | ICD-10-CM | POA: Diagnosis not present

## 2024-03-10 LAB — BASIC METABOLIC PANEL WITH GFR
Anion gap: 14 (ref 5–15)
BUN: 39 mg/dL — ABNORMAL HIGH (ref 8–23)
CO2: 22 mmol/L (ref 22–32)
Calcium: 8.3 mg/dL — ABNORMAL LOW (ref 8.9–10.3)
Chloride: 100 mmol/L (ref 98–111)
Creatinine, Ser: 1.56 mg/dL — ABNORMAL HIGH (ref 0.44–1.00)
GFR, Estimated: 31 mL/min — ABNORMAL LOW (ref >60)
Glucose, Bld: 90 mg/dL (ref 70–99)
Potassium: 3.6 mmol/L (ref 3.5–5.1)
Sodium: 136 mmol/L (ref 135–145)

## 2024-03-10 LAB — RESPIRATORY PANEL BY PCR

## 2024-03-10 LAB — CBC
HCT: 30.5 % — ABNORMAL LOW (ref 36.0–46.0)
Hemoglobin: 10.1 g/dL — ABNORMAL LOW (ref 12.0–15.0)
MCH: 27.6 pg (ref 26.0–34.0)
MCHC: 33.1 g/dL (ref 30.0–36.0)
MCV: 83.3 fL (ref 80.0–100.0)
Platelets: 159 10*3/uL (ref 150–400)
RBC: 3.66 MIL/uL — ABNORMAL LOW (ref 3.87–5.11)
RDW: 16.5 % — ABNORMAL HIGH (ref 11.5–15.5)
WBC: 17.1 10*3/uL — ABNORMAL HIGH (ref 4.0–10.5)
nRBC: 0 % (ref 0.0–0.2)

## 2024-03-10 LAB — BRAIN NATRIURETIC PEPTIDE: B Natriuretic Peptide: 550.4 pg/mL — ABNORMAL HIGH (ref 0.0–100.0)

## 2024-03-10 MED ORDER — AMIODARONE HCL 200 MG PO TABS: 400.0000 mg | ORAL_TABLET | Freq: Two times a day (BID) | ORAL | Status: DC

## 2024-03-10 MED ORDER — AMIODARONE HCL 200 MG PO TABS: 200.0000 mg | ORAL_TABLET | Freq: Two times a day (BID) | ORAL | Status: DC

## 2024-03-10 MED ORDER — AMIODARONE HCL 200 MG PO TABS
400.0000 mg | ORAL_TABLET | Freq: Two times a day (BID) | ORAL | Status: DC
  Filled 2024-03-10 (×2): qty 2

## 2024-03-10 MED ORDER — AMIODARONE HCL 200 MG PO TABS: 200.0000 mg | ORAL_TABLET | Freq: Every day | ORAL | Status: DC

## 2024-03-10 NOTE — Progress Notes (Addendum)
 Progress Note  Patient Name: Kristin Hancock Date of Encounter: 03/10/2024 [facility] Cardiologist: None   Interval Summary   Patient feeling good Removed supplemental oxygen this AM and SpO2 remained 96-100%  Patient's daughter concerned about possible UTI, will contact primary team  Vital Signs Vitals:   03/09/24 2001 03/10/24 0018 03/10/24 0409 03/10/24 0730  BP: (!) 109/48 (!) 94/45 (!) 95/53 (!) 107/57  Pulse: 65 (!) 58 (!) 57 62  Resp: 18 18 18 19   Temp: 98.8 F (37.1 C) 99.3 F (37.4 C) 98.2 F (36.8 C) 97.7 F (36.5 C)  TempSrc: Oral Oral Oral Oral  SpO2: 97% 96% 98% 98%  Weight:   76.4 kg   Height:        Intake/Output Summary (Last 24 hours) at 03/10/2024 1223 Last data filed at 03/10/2024 0900 Gross per 24 hour  Intake 240 ml  Output 700 ml  Net -460 ml      03/10/2024    4:09 AM 03/09/2024    6:36 AM 03/08/2024    4:17 PM  Last 3 Weights  Weight (lbs) 168 lb 6.9 oz 165 lb 12.6 oz 170 lb 6.7 oz  Weight (kg) 76.4 kg 75.2 kg 77.3 kg     Telemetry/ECG  Sinus rhythm, HR 60s - Personally Reviewed  Physical Exam  GEN: No acute distress.   Neck: No JVD Cardiac: RRR, no murmurs, rubs, or gallops.  Respiratory: bibasilar crackles. GI: Soft, nontender, non-distended  MS: No edema  Assessment & Plan  Persistent atrial fibrillation s/p DCCV  Underwent successful DCCV 03/05/2024 Maintaining sinus rhythm Continue amiodarone  400 mg BID until 6/18 then 200 mg BID Continue Eliquis  5 mg BID   Chronic HFmrEF Echo 01/2024 showed EF 45-50%, global hypokinesis, mildly reduced RV function, moderately elevated PASP, LA enlargement, mild to moderate TR, normal IVC ProBNP 3,665 CXR showed bilateral pleural effusions, interstitial edema Given IV Lasix 40 mg x 3 doses  Net -1 L this admission  Awaiting BMP from today but creatinine began to rise yesterday  Started on PO Lasix 40 mg daily today   Leukocytosis Worsening, 14.5 > 17.1 Reports chills  yesterday PM Further treatment per primary    For questions or updates, please contact [facility] Please consult [email or web address] for contact info under       Signed, Jiles Mote, PA-C   ADDENDUM:   Patient seen and examined with Jiles Mote, PA-C.  I personally taken a history, examined the patient, reviewed relevant notes,  laboratory data / imaging studies.  I performed a substantive portion of this encounter and formulated the important aspects of the plan.  I agree with the APP's note, impression, and recommendations; however, I have edited the note to reflect changes or salient points.   Denies anginal chest pain. Shortness of breath improving. No longer on nasal cannula oxygen. Daughter at bedside  PHYSICAL EXAM: Today's Vitals   03/10/24 0018 03/10/24 0409 03/10/24 0730 03/10/24 0830  BP: (!) 94/45 (!) 95/53 (!) 107/57   Pulse: (!) 58 (!) 57 62   Resp: 18 18 19    Temp: 99.3 F (37.4 C) 98.2 F (36.8 C) 97.7 F (36.5 C)   TempSrc: Oral Oral Oral   SpO2: 96% 98% 98%   Weight:  76.4 kg    Height:      PainSc:    0-No pain   Body mass index is 30.81 kg/m.   Net IO Since Admission: -990 mL [03/10/24 1223]  [employer]  03/08/24 1617 03/09/24 0636 03/10/24 0409  Weight: 77.3 kg 75.2 kg 76.4 kg    Physical Exam  Constitutional: No distress.  hemodynamically stable  Neck: No JVD present.  Cardiovascular: Normal rate, regular rhythm, S1 normal and S2 normal. Exam reveals no gallop, no S3 and no S4.  No murmur heard. Pulmonary/Chest: Effort normal. No stridor. She has no wheezes. Minimal rales left lower base  Musculoskeletal:        General: No edema.     Cervical back: Neck supple.  Skin: Skin is warm.    EKG: (personally reviewed by me) No new tracings  Telemetry: (personally reviewed by me) Sinus rhythm without ectopy   Impression:  Persistent atrial fibrillation. Long-term oral anticoagulation. Long-term antiarrhythmic  medications. Chronic heart failure with mildly reduced LVEF, stage C, NYHA class II/III. Hypertension. Hyperlipidemia. Acute kidney injury on chronic kidney disease-likely secondary to diuresis.   Recommendations:  Persistent atrial fibrillation Long-term oral anticoagulation Long-term antiarrhythmics Currently sinus rhythm. Recently underwent direct-current cardioversion. Rate control: N/A. Rhythm control: Amiodarone . Thromboembolic prophylaxis: Eliquis  Antiarrhythmic history: Flecainide  (discontinued secondary to change in LVEF) Currently on amiodarone  400 mg p.o. twice daily, will transition to 200 mg p.o. twice daily on March 12, 2024 for 7 days, followed by 200 mg p.o. daily Patient and her daughter are aware that amiodarone  will require follow-up labs to monitor thyroid  function, LFTs, lung function. Click Here to Calculate/Change CHADS2VASc Score The patient's CHADS2-VASc score is 5, indicating a 7.2% annual risk of stroke. CHF History: Yes HTN History: Yes Diabetes History: No Stroke History: No Vascular Disease History: No Risks, benefits, alternatives were anticoagulation discussed.  Recommend checking H&H and BMP every 6 months to monitor for anemia and renal function  Acute on Chronic heart failure with mildly reduced LVEF, stage C, NYHA class II/III Improving. Likely secondary to underlying A-fib/tachycardia Currently being diuresed. Will hold Lasix today given the rising serum creatinine level. Uptitrate GDMT once renal function improves. Strict I's and O's, daily weights Net IO Since Admission: -990 mL [03/10/24 1223] Home medications: Amlodipine  5 mg p.o. daily, atenolol  50 mg p.o. daily, hydrochlorothiazide  12.5 mg p.o. daily, losartan  100 mg p.o. daily.  GDMT will need to be adjusted given her reduction in LVEF.  Will await normalization of her renal function prior to additional changes.  Acute kidney injury: Likely secondary to diuresis Discontinue  Lasix Monitor BUN and creatinine  Leukocytosis: Defer workup to primary  Hypertension with heart failure chronic kidney disease stage III: Monitor BUN and creatinine, avoid nephrotoxic agents  Further recommendations to follow as the case evolves.   This note was created using a voice recognition software as a result there may be grammatical errors inadvertently enclosed that do not reflect the nature of this encounter. Every attempt is made to correct such errors.  As part of this encounter independently reviewed echocardiogram from Jan 29, 2024, cardiology consult note from Dr. Marven Slimmer March 09, 2024, EKG March 08, 2024, telemetry, last 24 hours vital signs, I's and O's, labs March 10, 2024, prescription drug management, spoke to/updated patient's daughter.   Olinda Bertrand, DO, [facility]  [address] [place], [place] [address] Pager: [phone] Office: [phone] 03/10/2024 12:23 PM

## 2024-03-10 NOTE — TOC CM/SW Note (Addendum)
 Transition of Care [facility]) - Inpatient Brief Assessment   Patient Details  Name: Kristin Hancock MRN: [id number] Date of Birth: October 27, 1930  Transition of Care [facility]) CM/SW Contact:    Jennett Model, RN Phone Number: 03/10/2024, 2:24 PM   Clinical Narrative: From home with daughter, has PCP and insurance on file, states has no HH services in place at this time, has a cane  at home.  States family member will transport them home at [employer] and family is support system, states gets medications from [facility] on [address] in [place].    Pta self ambulatory with cane.  Patient gives this NCM permission to speak with her daughter, Kristin Hancock.  She thinks she eats a low sodium diet, her daughter has a scale at home she can weigh herself on.  Per pt eval rec HHPT , NCM offered choice to patient and daughter, they have no preference,  NCM made referral to [facility] with Kristin Hancock, he is able to take referral.  Soc will begin 24 to 48 hrs post dc.    Transition of Care Asessment: Insurance and Status: Insurance coverage has been reviewed Patient has primary care physician: Yes Home environment has been reviewed: home with daughter and her family Prior level of function:: indep, uses a cane sometimes Prior/Current Home Services: Current home services (cane) Social Drivers of Health Review: SDOH reviewed no interventions necessary Readmission risk has been reviewed: Yes Transition of care needs: transition of care needs identified, TOC will continue to follow

## 2024-03-10 NOTE — Progress Notes (Signed)
 Heart Failure Navigator Progress Note  Assessed for Heart & Vascular TOC clinic readiness.  Patient does not meet criteria due to EF 45-50%, has a scheduled [facility] appointment on 04/04/2024. No HF TOC. .   Navigator will sign off at this time.   Randie Bustle, BSN, Scientist, clinical (histocompatibility and immunogenetics) Only

## 2024-03-10 NOTE — Progress Notes (Signed)
 [facility] Progress Note Patient: Kristin Hancock HYQ:[id number] DOB: 10/22/1930 DOA: 03/08/2024  DOS: the patient was seen and examined on 03/10/2024  Brief Hospital Course: Patient with PMH of persistent A-fib, chronic systolic CHF, HTN, HLD, obesity presented to the hospital with complaints of progressively worsening shortness of breath. Currently being treated for CHF exacerbation.  Assessment and Plan: Acute on chronic systolic CHF Recent echocardiogram as above showed EF of 45 to 50%. Mild MS, mild TR. Recently underwent cardioversion. Was taking atenolol  50 mg twice daily as prescribed. Cardiology consulted.  Will follow recommendation for diuresis. Monitor BMP.  Persistent atrial fibrillation  Patient is on amiodarone  and Eliquis . Continue to taper the amiodarone  dose as already planned outpatient. Starting 6/18 we will be reducing it to 200 mg twice daily for 2 weeks followed by 200 mg daily. Given the recency of the amiodarone  use suspect that the amiodarone  induced pulmonary toxicity is less likely CRP normal. ESR mildly elevated.  Leukocytosis. Etiology not clear. Likely stress reaction. No fever no chills. UA has some pyuria but dysuria symptoms is not persistent.  And no fever. Chest x-ray does not show any new infiltrate. Procalcitonin level is also negative. CRP 0.9. At present no indication for antibiotics. Will check RVP.  HTN  Blood pressure is very soft at home Patient has had family gram amlodipine , 50 mg atenolol , 12.5 mg HCTZ and 100 mg Cozaar . Currently I will be holding all medications. May need to be on GDMT for her systolic dysfunction. Will defer to cardiology  HLD  On statin. Monitor.  AKI Likely cardiorenal hemodynamics. Recent serum creatinine 0.9 on 6/10. Serum creatinine raised to 1.31 on 6/14. Improving after diuresis, now worsening again to 1.56. Monitor closely.  Goals of care conversation. Discussed in details with  regards to her goals of care. Wants to be full code for now. Wants her daughter to be her HCPOA.   Nocturnal hypoxia. Will check overnight oximetry.  Subjective: Has some cough.  No nausea no vomiting no fever no chills.  Reported to have difficulty with breathing last night and also required oxygen last night.  She feels that she is breathing better today compared to yesterday.  Physical Exam: General: in Mild distress, No Rash Cardiovascular: S1 and S2 Present, No Murmur Respiratory: Good respiratory effort, Bilateral Air entry present.  Bilateral crackles, No wheezes Abdomen: Bowel Sound present, No tenderness Extremities: Improving edema Neuro: Alert and oriented x3, no new focal deficit  Data Reviewed: I have Reviewed nursing notes, Vitals, and Lab results. Since last encounter, pertinent lab results CBC and BMP   . I have ordered test including CBC and BMP  .  Discussed with cardiology  Disposition: Status is: Inpatient Remains inpatient appropriate because: Monitor for improvement in respiratory symptoms  SCDs Start: 03/08/24 1715 apixaban  (ELIQUIS ) tablet 5 mg   Family Communication: Family at bedside Level of care: Telemetry Cardiac   Vitals:   03/09/24 2001 03/10/24 0018 03/10/24 0409 03/10/24 0730  BP: (!) 109/48 (!) 94/45 (!) 95/53 (!) 107/57  Pulse: 65 (!) 58 (!) 57 62  Resp: 18 18 18 19   Temp: 98.8 F (37.1 C) 99.3 F (37.4 C) 98.2 F (36.8 C) 97.7 F (36.5 C)  TempSrc: Oral Oral Oral Oral  SpO2: 97% 96% 98% 98%  Weight:   76.4 kg   Height:         Author: Charlean Congress, MD 03/10/2024 1:11 PM  Please look on [email or web address] to find out who is on call.

## 2024-03-10 NOTE — Plan of Care (Signed)
  Problem: Education: Goal: Knowledge of General Education information will improve Description: Including pain rating scale, medication(s)/side effects and non-pharmacologic comfort measures Outcome: Progressing   Problem: Health Behavior/Discharge Planning: Goal: Ability to manage health-related needs will improve Outcome: Progressing   Problem: Clinical Measurements: Goal: Diagnostic test results will improve Outcome: Progressing Goal: Respiratory complications will improve Outcome: Progressing Goal: Cardiovascular complication will be avoided Outcome: Progressing   Problem: Nutrition: Goal: Adequate nutrition will be maintained Outcome: Progressing

## 2024-03-10 NOTE — Evaluation (Signed)
 Physical Therapy Evaluation Patient Details Name: Kristin Hancock MRN: [id number] DOB: 08-May-1931 Today's Date: 03/10/2024  History of Present Illness  Pt is a 89 y/o female admitted 6/14 with progressively worsening SOB due to acute on chronic CHF and return of afib post recent cardioversion.  PMHx  afib, HTN  Clinical Impression  Pt admitted with/for SOB in part due to A on Chronic CHF.  Pt needing CG level of assist overall..  Pt currently limited functionally due to the problems listed below.  (see problems list.)  Pt will benefit from PT to maximize function and safety to be able to get home safely with available assist.         If plan is discharge home, recommend the following: Assist for transportation (PRN assist otherwise)   Can travel by private vehicle        Equipment Recommendations None recommended by PT  Recommendations for Other Services       Functional Status Assessment Patient has had a recent decline in their functional status and demonstrates the ability to make significant improvements in function in a reasonable and predictable amount of time.     Precautions / Restrictions Precautions Precautions: Fall (lower risk) Recall of Precautions/Restrictions: Intact      Mobility  Bed Mobility               General bed mobility comments: OOB in the chair on arrival    Transfers Overall transfer level: Needs assistance   Transfers: Sit to/from Stand Sit to Stand: Contact guard assist           General transfer comment: appropriate use of UE for assist    Ambulation/Gait Ambulation/Gait assistance: Contact guard assist Gait Distance (Feet): 180 Feet Assistive device: Rolling walker (2 wheels) Gait Pattern/deviations: Step-through pattern   Gait velocity interpretation: <1.31 ft/sec, indicative of household ambulator   General Gait Details: generally steady in the RW.  VSS on RA, no overt SOB.  Stairs            Wheelchair  Mobility     Tilt Bed    Modified Rankin (Stroke Patients Only)       Balance Overall balance assessment: Needs assistance Sitting-balance support: No upper extremity supported, Feet supported Sitting balance-Leahy Scale: Good     Standing balance support: No upper extremity supported Standing balance-Leahy Scale: Fair                               Pertinent Vitals/Pain Pain Assessment Pain Assessment: Faces Faces Pain Scale: No hurt Pain Intervention(s): Monitored during session    Home Living Family/patient expects to be discharged to:: Private residence Living Arrangements: Children Available Help at Discharge: Family;Other (Comment) (someone available much of the time, but still PRN) Type of Home: House Home Access: Stairs to enter Entrance Stairs-Rails: Right;Left Entrance Stairs-Number of Steps: 4   Home Layout: Two level;Able to live on main level with bedroom/bathroom Home Equipment: Shower seat;Rolling Walker (2 wheels);Cane - single point      Prior Function Prior Level of Function : Independent/Modified Independent             Mobility Comments: mod I in the home, drove and ran errands Independently ADLs Comments: Independent in ADL's     Extremity/Trunk Assessment   Upper Extremity Assessment Upper Extremity Assessment: Overall WFL for tasks assessed    Lower Extremity Assessment Lower Extremity Assessment: Overall WFL for tasks assessed (mild  weaknesses)    Cervical / Trunk Assessment Cervical / Trunk Assessment: Normal  Communication   Communication Communication: No apparent difficulties    Cognition Arousal: Alert Behavior During Therapy: WFL for tasks assessed/performed   PT - Cognitive impairments: No apparent impairments                         Following commands: Intact       Cueing Cueing Techniques: Verbal cues     General Comments      Exercises     Assessment/Plan    PT Assessment  Patient needs continued PT services  PT Problem List Decreased strength;Decreased activity tolerance;Decreased mobility;Decreased knowledge of use of DME;Cardiopulmonary status limiting activity       PT Treatment Interventions DME instruction;Gait training;Stair training;Functional mobility training;Therapeutic activities;Balance training;Patient/family education    PT Goals (Current goals can be found in the Care Plan section)  Acute Rehab PT Goals Patient Stated Goal: back mod I at home PT Goal Formulation: With patient Time For Goal Achievement: 03/17/24 Potential to Achieve Goals: Good    Frequency Min 3X/week     Co-evaluation               AM-PAC PT 6 Clicks Mobility  Outcome Measure Help needed turning from your back to your side while in a flat bed without using bedrails?: A Little Help needed moving from lying on your back to sitting on the side of a flat bed without using bedrails?: A Little Help needed moving to and from a bed to a chair (including a wheelchair)?: A Little Help needed standing up from a chair using your arms (e.g., wheelchair or bedside chair)?: A Little Help needed to walk in hospital room?: A Little Help needed climbing 3-5 steps with a railing? : A Little 6 Click Score: 18    End of Session   Activity Tolerance: Patient tolerated treatment well Patient left: in chair;with call bell/phone within reach;with family/visitor present Nurse Communication: Mobility status PT Visit Diagnosis: Muscle weakness (generalized) (M62.81);Difficulty in walking, not elsewhere classified (R26.2)    Time: 1350-1410 PT Time Calculation (min) (ACUTE ONLY): 20 min   Charges:   PT Evaluation $PT Eval Moderate Complexity: 1 Mod   PT General Charges $$ ACUTE PT VISIT: 1 Visit         03/10/2024  Kristin Batters., PT Acute Rehabilitation Services [phone]  (office)  Kristin Hancock 03/10/2024, 2:25 PM

## 2024-03-10 NOTE — Progress Notes (Signed)
 Pt placed on overnight pulse ox at this time.

## 2024-03-11 DIAGNOSIS — I4819 Other persistent atrial fibrillation: Secondary | ICD-10-CM | POA: Diagnosis not present

## 2024-03-11 DIAGNOSIS — E782 Mixed hyperlipidemia: Secondary | ICD-10-CM

## 2024-03-11 DIAGNOSIS — I130 Hypertensive heart and chronic kidney disease with heart failure and stage 1 through stage 4 chronic kidney disease, or unspecified chronic kidney disease: Secondary | ICD-10-CM

## 2024-03-11 DIAGNOSIS — N179 Acute kidney failure, unspecified: Secondary | ICD-10-CM | POA: Diagnosis not present

## 2024-03-11 DIAGNOSIS — I5021 Acute systolic (congestive) heart failure: Secondary | ICD-10-CM | POA: Diagnosis not present

## 2024-03-11 DIAGNOSIS — D72829 Elevated white blood cell count, unspecified: Secondary | ICD-10-CM

## 2024-03-11 DIAGNOSIS — N183 Chronic kidney disease, stage 3 unspecified: Secondary | ICD-10-CM

## 2024-03-11 LAB — BASIC METABOLIC PANEL WITH GFR
Anion gap: 12 (ref 5–15)
BUN: 41 mg/dL — ABNORMAL HIGH (ref 8–23)
CO2: 25 mmol/L (ref 22–32)
Calcium: 8.1 mg/dL — ABNORMAL LOW (ref 8.9–10.3)
Chloride: 99 mmol/L (ref 98–111)
Creatinine, Ser: 1.47 mg/dL — ABNORMAL HIGH (ref 0.44–1.00)
GFR, Estimated: 33 mL/min — ABNORMAL LOW (ref >60)
Glucose, Bld: 106 mg/dL — ABNORMAL HIGH (ref 70–99)
Potassium: 3.8 mmol/L (ref 3.5–5.1)
Sodium: 136 mmol/L (ref 135–145)

## 2024-03-11 LAB — CBC WITH DIFFERENTIAL/PLATELET
Abs Immature Granulocytes: 0.03 10*3/uL (ref 0.00–0.07)
Basophils Absolute: 0 10*3/uL (ref 0.0–0.1)
Basophils Relative: 0 %
Eosinophils Absolute: 0.2 10*3/uL (ref 0.0–0.5)
Eosinophils Relative: 2 %
HCT: 32.2 % — ABNORMAL LOW (ref 36.0–46.0)
Hemoglobin: 10.4 g/dL — ABNORMAL LOW (ref 12.0–15.0)
Immature Granulocytes: 0 %
Lymphocytes Relative: 16 %
Lymphs Abs: 1.6 10*3/uL (ref 0.7–4.0)
MCH: 27 pg (ref 26.0–34.0)
MCHC: 32.3 g/dL (ref 30.0–36.0)
MCV: 83.6 fL (ref 80.0–100.0)
Monocytes Absolute: 0.8 10*3/uL (ref 0.1–1.0)
Monocytes Relative: 8 %
Neutro Abs: 7.6 10*3/uL (ref 1.7–7.7)
Neutrophils Relative %: 74 %
Platelets: 162 10*3/uL (ref 150–400)
RBC: 3.85 MIL/uL — ABNORMAL LOW (ref 3.87–5.11)
RDW: 16.1 % — ABNORMAL HIGH (ref 11.5–15.5)
WBC: 10.2 10*3/uL (ref 4.0–10.5)
nRBC: 0 % (ref 0.0–0.2)

## 2024-03-11 LAB — MAGNESIUM: Magnesium: 1.8 mg/dL (ref 1.7–2.4)

## 2024-03-11 MED ORDER — AMIODARONE HCL 200 MG PO TABS: 200.0000 mg | ORAL_TABLET | Freq: Every day | ORAL | Status: DC

## 2024-03-11 MED ORDER — AMIODARONE HCL 200 MG PO TABS
200.0000 mg | ORAL_TABLET | Freq: Two times a day (BID) | ORAL | Status: DC
  Administered 2024-03-11 – 2024-03-12 (×4): 200 mg via ORAL
  Filled 2024-03-11 (×4): qty 1

## 2024-03-11 NOTE — Progress Notes (Signed)
 [facility] Progress Note Patient: Kristin Hancock ZOX:[id number] DOB: November 03, 1930 DOA: 03/08/2024  DOS: the patient was seen and examined on 03/11/2024  Brief Hospital Course: Patient with PMH of persistent A-fib, chronic systolic CHF, HTN, HLD, obesity presented to the hospital with complaints of progressively worsening shortness of breath. Currently being treated for CHF exacerbation.  Assessment and Plan: Acute on chronic systolic CHF Recent echocardiogram as above showed EF of 45 to 50%. Mild MS, mild TR. Recently underwent cardioversion. Was taking atenolol  50 mg twice daily as prescribed. Cardiology consulted.  Will follow recommendation for diuresis. Currently on hold due to worsening renal function. Still has crackles on her lower one third of posterior lung. Monitor BMP.  Persistent atrial fibrillation  Patient is on amiodarone  and Eliquis . Given the recency of the amiodarone  use suspect that the amiodarone  induced pulmonary toxicity is less likely.  Cardiology modified the amiodarone  taper given excellent control. Hold beta-blocker. CRP normal. ESR mildly elevated.  Leukocytosis. Etiology not clear. Likely stress reaction. No fever no chills. UA has some pyuria but dysuria symptoms is not persistent.  And no fever. Chest x-ray does not show any new infiltrate. Procalcitonin level is also negative. CRP 0.9. At present no indication for antibiotics. RVP negative.  Leukocytosis resolved.  HTN  Blood pressure is very soft at home Patient has had family gram amlodipine , 50 mg atenolol , 12.5 mg HCTZ and 100 mg Cozaar . Currently I will be holding all medications. May need to be on GDMT for her systolic dysfunction already due to renal function cardiology currently recommending to hold off on this until patient is seen in the outpatient clinic.  HLD  On statin. Monitor.  AKI Initially cardiorenal hemodynamics.  Now likely from overdiuresis. Although  clinically still has basal crackles as well as weight gain.  Thus likely still remains volume overloaded. Recent serum creatinine 0. remains Serum creatinine peaked at 1.56 and now trending down after holding diuresis. Currently will monitor closely in the hospital.  Goals of care conversation. Discussed in details with regards to her goals of care. Wants to be full code for now. Wants her daughter to be her HCPOA.  Nocturnal hypoxia. Likely sleep apnea. Oxygenation drops to 80% at nighttime.  Remains below 89% for 55 minutes at nighttime.  Will arrange 2 LPM oxygen on discharge to be used nightly.   Subjective: Feeling better.  No nausea no vomiting.  Physical Exam: General: in Mild distress, No Rash Cardiovascular: S1 and S2 Present, No Murmur Respiratory: Good respiratory effort, Bilateral Air entry present.  Improving but persistent basal crackles, No wheezes Abdomen: Bowel Sound present, No tenderness Extremities: No edema Neuro: Alert and oriented x3, no new focal deficit  Data Reviewed: I have Reviewed nursing notes, Vitals, and Lab results. Since last encounter, pertinent lab results CBC and BMP   . I have ordered test including BMP  . I have discussed pt's care plan and test results with cardiology  .   Disposition: Status is: Inpatient Remains inpatient appropriate because: Monitor for improvement in volume status and renal function  SCDs Start: 03/08/24 1715 apixaban  (ELIQUIS ) tablet 5 mg   Family Communication: Daughter at bedside Level of care: Telemetry Cardiac   Vitals:   03/11/24 0449 03/11/24 0740 03/11/24 1150 03/11/24 1545  BP: (!) 112/50  (!) 112/51 (!) 117/54  Pulse:  67  (!) 57  Resp: 20 18 17 15   Temp: 98.1 F (36.7 C)  97.6 F (36.4 C) 97.9 F (36.6 C)  TempSrc: Oral  Oral Oral Oral  SpO2: 92%  91% 94%  Weight: 75.6 kg     Height:         Author: Charlean Congress, MD 03/11/2024 7:37 PM  Please look on [email or web address] to find out who is on  call.

## 2024-03-11 NOTE — Progress Notes (Addendum)
 Progress Note  Patient Name: Kristin Hancock Date of Encounter: 03/11/2024 [facility] Cardiologist: None   Interval Summary   Resting in chair, daughter in room Patient reports not sleeping well last night because she kept waking up feeling short of breath Pulse ox was checked throughout the night    Vital Signs Vitals:   03/10/24 2005 03/11/24 0014 03/11/24 0449 03/11/24 0740  BP: 103/72 (!) 102/48 (!) 112/50   Pulse: 60   67  Resp: 18 19 20 18   Temp: 98.2 F (36.8 C) 98.2 F (36.8 C) 98.1 F (36.7 C)   TempSrc: Oral Oral Oral Oral  SpO2: 94% 93% 92%   Weight:   75.6 kg   Height:        Intake/Output Summary (Last 24 hours) at 03/11/2024 1022 Last data filed at 03/11/2024 0900 Gross per 24 hour  Intake 600 ml  Output 700 ml  Net -100 ml      03/11/2024    4:49 AM 03/10/2024    4:09 AM 03/09/2024    6:36 AM  Last 3 Weights  Weight (lbs) 166 lb 9.6 oz 168 lb 6.9 oz 165 lb 12.6 oz  Weight (kg) 75.569 kg 76.4 kg 75.2 kg      Telemetry/ECG  Sinus rhythm, HR 60s  - Personally Reviewed  Physical Exam  GEN: No acute distress.   Neck: No JVD Cardiac: RRR, no murmurs, rubs, or gallops.  Respiratory: bibasilar crackles noted. GI: Soft, nontender, non-distended  MS: No edema  Assessment & Plan  Persistent atrial fibrillation s/p DCCV  Underwent successful DCCV 03/05/2024 Maintaining sinus rhythm Continue amiodarone , patient and her daughter confirm that the patient is actually further ahead of her schedule than anticipated so will transition to amiodarone  200 mg BID x 2 weeks then 200 mg daily after that starting today  Patient will require follow up labs due to starting amiodarone   Continue Eliquis  5 mg BID    Chronic HFmrEF Echo 01/2024 showed EF 45-50%, global hypokinesis, mildly reduced RV function, moderately elevated PASP, LA enlargement, mild to moderate TR, normal IVC ProBNP 3,665 on 03/08/24 and now 550 03/10/2024 CXR showed bilateral pleural  effusions, interstitial edema Given IV Lasix; now held due to AKI Net IO Since Admission: -1,090 mL [03/11/24 1255] Creatinine trending down, 1.4 today   Home meds: amlodipine  5 mg daily, atenolol  50 mg daily, hydrochlorothiazide  12.5 mg daily, losartan  100 mg daily  Continue strict I&O's, daily weights, daily BMPs Waiting for renal function to return to baseline prior to adding back home medications     For questions or updates, please contact [facility] Please consult [email or web address] for contact info under       Signed, Jiles Mote, PA-C   ADDENDUM:   Patient seen and examined with Jiles Mote, PA-C.  I personally taken a history, examined the patient, reviewed relevant notes,  laboratory data / imaging studies.  I performed a substantive portion of this encounter and formulated the important aspects of the plan.  I agree with the APP's note, impression, and recommendations; however, I have edited the note to reflect changes or salient points.   Accompanied by her daughter at bedside. Denies anginal chest pain or heart failure symptoms.   Shortness of breath improving. Feels better with nocturnal oxygen supplementation  PHYSICAL EXAM: Today's Vitals   03/10/24 2005 03/11/24 0014 03/11/24 0449 03/11/24 0740  BP: 103/72 (!) 102/48 (!) 112/50   Pulse: 60   67  Resp: 18  19 20 18   Temp: 98.2 F (36.8 C) 98.2 F (36.8 C) 98.1 F (36.7 C)   TempSrc: Oral Oral Oral Oral  SpO2: 94% 93% 92%   Weight:   75.6 kg   Height:      PainSc:       Body mass index is 30.47 kg/m.   Net IO Since Admission: -1,090 mL [03/11/24 1256]  Filed Weights   03/09/24 0636 03/10/24 0409 03/11/24 0449  Weight: 75.2 kg 76.4 kg 75.6 kg    Physical Exam  Constitutional: No distress.  hemodynamically stable  Neck: No JVD present.  Cardiovascular: Normal rate, regular rhythm, S1 normal and S2 normal. Exam reveals no gallop, no S3 and no S4.  No murmur  heard. Pulmonary/Chest: Effort normal. No stridor. She has no wheezes. She has no rales.  Musculoskeletal:        General: No edema.     Cervical back: Neck supple.  Skin: Skin is warm.    EKG: (personally reviewed by me) No new tracings  Telemetry: (personally reviewed by me) Sinus rhythm without ectopy   Impression:  Impression:  Persistent atrial fibrillation. Long-term oral anticoagulation. Long-term antiarrhythmic medications. Chronic heart failure with mildly reduced LVEF, stage C, NYHA class II/III. Hypertension. Hyperlipidemia. Acute kidney injury on chronic kidney disease-likely secondary to diuresis.     Recommendations:  Persistent atrial fibrillation Long-term oral anticoagulation Long-term antiarrhythmics Currently sinus rhythm. Recently underwent direct-current cardioversion. Rate control: N/A. Rhythm control: Amiodarone . Thromboembolic prophylaxis: Eliquis  Antiarrhythmic history: Flecainide  (discontinued secondary to change in LVEF) Currently on oral amiodarone  load.  Will start 200 mg p.o. twice daily as mentioned above for 2 week thereafter 200 mg p.o. daily  Patient and her daughter are aware that amiodarone  will require follow-up labs to monitor thyroid  function, LFTs, lung function. Click Here to Calculate/Change CHADS2VASc Score The patient's CHADS2-VASc score is 5, indicating a 7.2% annual risk of stroke. CHF History: Yes HTN History: Yes Diabetes History: No Stroke History: No Vascular Disease History: No Risks, benefits, alternatives were anticoagulation discussed.  Recommend checking H&H and BMP every 6 months to monitor for anemia and renal function I suspect that she will need a low-dose AV nodal blocking agents once the amiodarone  load is complete.  However given her controlled rate we will hold off on AV nodal blocking agents for now and reevaluate as outpatient.   Acute on Chronic heart failure with mildly reduced LVEF, stage C, NYHA class  II Improving. Likely secondary to underlying A-fib/tachycardia Has been diuresed very well with IV Lasix; however, developed acute kidney injury. Therefore currently diuretics and GDMT are being held Renal function slowly improving Given her advanced age at 63, creatinine clearance of 23, and acute kidney injury will hold off on GDMT. Would recommend close outpatient follow-up for uptitration of GDMT-low-dose ARB and MRA. Clinically not volume overloaded. BNP levels have significantly improved. Recommend incentive spirometer at bedside And outpatient sleep study   Acute kidney injury: Improving Likely secondary to diuresis Discontinue Lasix Monitor BUN and creatinine See above   Leukocytosis: Defer workup to primary   Hypertension with heart failure chronic kidney disease stage III: Monitor BUN and creatinine, avoid nephrotoxic agents, renal dose medications when appropriate.  Plan of care discussed with attending physician.  Further recommendations to follow as the case evolves.   This note was created using a voice recognition software as a result there may be grammatical errors inadvertently enclosed that do not reflect the nature of this encounter. Every attempt is  made to correct such errors.  As part of today's encounter discussed care with patient, daughter, attending physician, independently reviewed labs 03/11/2024, last 24-hour vital signs ins and outs and telemetry, prescription drug management as mentioned above.  If patient is going to be discharged sooner we will arrange outpatient follow-up, will await recommendations from primary team.   Olinda Bertrand, DO, [facility]  [facility]  [address] [place], [place] [address] Pager: [phone] Office: [phone] 03/11/2024 12:56 PM

## 2024-03-11 NOTE — Plan of Care (Signed)
   Problem: Clinical Measurements: Goal: Respiratory complications will improve Outcome: Progressing   Problem: Activity: Goal: Risk for activity intolerance will decrease Outcome: Progressing

## 2024-03-11 NOTE — Progress Notes (Signed)
 Mobility Specialist Progress Note:   03/11/24 1430  Mobility  Activity Ambulated with assistance in hallway  Level of Assistance Contact guard assist, steadying assist  Assistive Device Front wheel walker  Distance Ambulated (ft) 220 ft  Activity Response Tolerated well  Mobility Referral Yes  Mobility visit 1 Mobility  Mobility Specialist Start Time (ACUTE ONLY) 1430  Mobility Specialist Stop Time (ACUTE ONLY) 1445  Mobility Specialist Time Calculation (min) (ACUTE ONLY) 15 min   Post Mobility: SpO2 88-92% RA  Pt agreeable to mobility session. No physical assistance required, only minG for safety. Pt c/o minor SOB with exertion, SpO2 88% on RA immediately after. Recovered to 92% quickly. Encouraged frequent ambulation. Left in chair with all needs met.   Oneda Big Mobility Specialist Please contact via SecureChat or  Rehab office at [phone]

## 2024-03-12 DIAGNOSIS — I5023 Acute on chronic systolic (congestive) heart failure: Secondary | ICD-10-CM | POA: Diagnosis not present

## 2024-03-12 DIAGNOSIS — I4819 Other persistent atrial fibrillation: Secondary | ICD-10-CM | POA: Diagnosis not present

## 2024-03-12 DIAGNOSIS — I130 Hypertensive heart and chronic kidney disease with heart failure and stage 1 through stage 4 chronic kidney disease, or unspecified chronic kidney disease: Secondary | ICD-10-CM | POA: Diagnosis not present

## 2024-03-12 DIAGNOSIS — N179 Acute kidney failure, unspecified: Secondary | ICD-10-CM | POA: Diagnosis not present

## 2024-03-12 DIAGNOSIS — I5021 Acute systolic (congestive) heart failure: Secondary | ICD-10-CM | POA: Diagnosis not present

## 2024-03-12 LAB — BASIC METABOLIC PANEL WITH GFR
Anion gap: 9 (ref 5–15)
BUN: 30 mg/dL — ABNORMAL HIGH (ref 8–23)
CO2: 27 mmol/L (ref 22–32)
Calcium: 8.4 mg/dL — ABNORMAL LOW (ref 8.9–10.3)
Chloride: 101 mmol/L (ref 98–111)
Creatinine, Ser: 1.15 mg/dL — ABNORMAL HIGH (ref 0.44–1.00)
GFR, Estimated: 45 mL/min — ABNORMAL LOW (ref >60)
Glucose, Bld: 107 mg/dL — ABNORMAL HIGH (ref 70–99)
Potassium: 3.8 mmol/L (ref 3.5–5.1)
Sodium: 137 mmol/L (ref 135–145)

## 2024-03-12 LAB — UREA NITROGEN, URINE: Urea Nitrogen, Ur: 267 mg/dL

## 2024-03-12 LAB — MAGNESIUM: Magnesium: 2 mg/dL (ref 1.7–2.4)

## 2024-03-12 MED ORDER — LOSARTAN POTASSIUM 25 MG PO TABS
25.0000 mg | ORAL_TABLET | Freq: Every day | ORAL | Status: DC
  Administered 2024-03-12: 25 mg via ORAL
  Filled 2024-03-12 (×2): qty 1

## 2024-03-12 MED ORDER — FUROSEMIDE 20 MG PO TABS: 20.0000 mg | ORAL_TABLET | Freq: Every day | ORAL | Status: DC | PRN

## 2024-03-12 NOTE — Progress Notes (Signed)
 Mobility Specialist Progress Note:    03/12/24 1157  Mobility  Activity Ambulated with assistance in hallway  Level of Assistance Contact guard assist, steadying assist  Assistive Device Front wheel walker  Distance Ambulated (ft) 250 ft  Activity Response Tolerated well  Mobility Referral Yes  Mobility Specialist Start Time (ACUTE ONLY) 1157  Mobility Specialist Stop Time (ACUTE ONLY) 1210  Mobility Specialist Time Calculation (min) (ACUTE ONLY) 13 min   Post Mobility: SpO2 88% w/activity. Returned to 92% post activity.  Pt agreeable to session. Steady gait and balance during mobilization w/ RW. Tolerated well w/ no c/o. Left in recliner.  Kristin Hancock Mobility Specialist Please contact via SecureChat or  Rehab office at [phone]

## 2024-03-12 NOTE — Progress Notes (Signed)
      [facility] Progress Note Patient: Kristin Hancock NWG:[id number] DOB: 12/04/1930 DOA: 03/08/2024  DOS: the patient was seen and examined on 03/12/2024  Brief Hospital Course: Patient with PMH of persistent A-fib, chronic systolic CHF, HTN, HLD, obesity presented to the hospital with complaints of progressively worsening shortness of breath. Currently being treated for CHF exacerbation.    Assessment and Plan: Acute on chronic systolic CHF BNP 578 Recent echocardiogram as above showed EF of 45 to 50%, mild MS, mild TR Cardiology consulted, appreciate recs Diuresis currently on hold due to worsening renal function Daily BMP   Persistent atrial fibrillation  Patient is on amiodarone  and Eliquis . Recently underwent cardioversion Cardiology modified the amiodarone  taper given excellent control Hold beta-blocker  AKI Possibly likely from overdiuresis Serum creatinine peaked at 1.56 and now trending down after holding diuresis Daily BMP   HTN  BP stable Continue losartan  for now   HLD  On statin   Goals of care conversation Discussed in details with regards to her goals of care. Wants to be full code for now. Wants her daughter to be her HCPOA.   Nocturnal hypoxia Likely sleep apnea Oxygenation drops to 80% at nighttime.  Remains below 89% for 55 minutes at nighttime.  Will arrange 2 LPM oxygen on discharge to be used nightly.     Subjective Denies any new complaints, feels better.  Patient still desaturating upon ambulating  Physical Exam: General: NAD  Cardiovascular: S1, S2 present Respiratory: CTAB Abdomen: Soft, nontender, nondistended, bowel sounds present Musculoskeletal: No bilateral pedal edema noted Skin: Normal Psychiatry: Normal mood     Disposition: Status is: Inpatient Remains inpatient appropriate because: Level of care  SCDs Start: 03/08/24 1715 apixaban  (ELIQUIS ) tablet 5 mg   Family Communication: None at bedside Level of  care: Telemetry Cardiac   Vitals:   03/12/24 0452 03/12/24 0457 03/12/24 0743 03/12/24 1124  BP: (!) 124/59  (!) 130/56 121/75  Pulse: 65  64 (!) 57  Resp: 19  20 20   Temp: 97.9 F (36.6 C)  97.9 F (36.6 C) 98 F (36.7 C)  TempSrc: Oral  Oral Oral  SpO2: 100%  94% 97%  Weight:  75 kg    Height:         Author: Veronica Gordon, MD 03/12/2024 3:00 PM  Please look on [email or web address] to find out who is on call.

## 2024-03-12 NOTE — Progress Notes (Addendum)
 Progress Note  Patient Name: Kristin Hancock Date of Encounter: 03/12/2024 [facility] Cardiologist: Marven Slimmer  Interval Summary   Patient up in chair, son present in room Feels much better today when using 2 L oxygen overnight to sleep No complaints  Renal function improving today, nearly back at baseline   Remains in sinus with HR 60s   Vital Signs Vitals:   03/12/24 0021 03/12/24 0452 03/12/24 0457 03/12/24 0743  BP: (!) 116/58 (!) 124/59  (!) 130/56  Pulse:  65  64  Resp: 17 19  20   Temp: 97.6 F (36.4 C) 97.9 F (36.6 C)  97.9 F (36.6 C)  TempSrc: Oral Oral  Oral  SpO2: 98% 100%  94%  Weight:   75 kg   Height:        Intake/Output Summary (Last 24 hours) at 03/12/2024 1034 Last data filed at 03/12/2024 0855 Gross per 24 hour  Intake 797 ml  Output 600 ml  Net 197 ml      03/12/2024    4:57 AM 03/11/2024    4:49 AM 03/10/2024    4:09 AM  Last 3 Weights  Weight (lbs) 165 lb 4.8 oz 166 lb 9.6 oz 168 lb 6.9 oz  Weight (kg) 74.98 kg 75.569 kg 76.4 kg     Telemetry/ECG  Sinus rhythm, HR 60s - Personally Reviewed  Physical Exam  GEN: No acute distress.   Neck: No JVD Cardiac: RRR, no murmurs, rubs, or gallops.  Respiratory: improving bibasilar crackles noted. GI: Soft, nontender, non-distended  MS: No edema  Assessment & Plan  Persistent atrial fibrillation s/p DCCV  Underwent successful DCCV 03/05/2024 Maintaining sinus rhythm BP 130/56 HR 60s Continue amiodarone , 200 mg BID x 2 weeks then 200 mg daily (starts 7/1) Patient will require follow up labs after starting amiodarone   Continue Eliquis  5 mg BID  Consider AV nodal blocker in future once amiodarone  load is complete if BP and HR can tolerate    Chronic HFmrEF Echo 01/2024 showed EF 45-50%, global hypokinesis, mildly reduced RV function, moderately elevated PASP, LA enlargement, mild to moderate TR, normal IVC ProBNP 3,665 > BNP 550 CXR showed bilateral pleural effusions, interstitial  edema Creatinine trending down, 1.15 today  -- almost back to baseline  Home meds: amlodipine  5 mg daily, atenolol  50 mg daily, hydrochlorothiazide  12.5 mg daily, losartan  100 mg daily  Continue strict I&O's, daily weights, daily BMPs Waiting for renal function to return to baseline prior to adding back home medications   Follow up already scheduled with Renee on 7/11 and Dr. Marven Slimmer 9/26.  Can arrange closer gen cards follow up once patient is set to be discharged    For questions or updates, please contact [facility] Please consult [email or web address] for contact info under       Signed, Jiles Mote, PA-C   ADDENDUM:   Patient seen and examined with Jiles Mote, PA-C.  I personally taken a history, examined the patient, reviewed relevant notes,  laboratory data / imaging studies.  I performed a substantive portion of this encounter and formulated the important aspects of the plan.  I agree with the APP's note, impression, and recommendations; however, I have edited the note to reflect changes or salient points.   Denies anginal chest pain or heart failure symptoms. No events overnight. No family at bedside  PHYSICAL EXAM: Today's Vitals   03/12/24 0457 03/12/24 0743 03/12/24 0930 03/12/24 1124  BP:  (!) 130/56  121/75  Pulse:  64  (!) 57  Resp:  20  20  Temp:  97.9 F (36.6 C)  98 F (36.7 C)  TempSrc:  Oral  Oral  SpO2:  94%  97%  Weight: 75 kg     Height:      PainSc:   0-No pain    Body mass index is 30.23 kg/m.   Net IO Since Admission: -596 mL [03/12/24 1322]  Filed Weights   03/10/24 0409 03/11/24 0449 03/12/24 0457  Weight: 76.4 kg 75.6 kg 75 kg    Physical Exam  Constitutional: No distress.  hemodynamically stable  Neck: No JVD present.  Cardiovascular: Normal rate, regular rhythm, S1 normal and S2 normal. Exam reveals no gallop, no S3 and no S4.  No murmur heard. Pulmonary/Chest: Effort normal. No stridor. She has no wheezes. She  has no rales.  Musculoskeletal:        General: No edema.     Cervical back: Neck supple.  Skin: Skin is warm.    EKG: (personally reviewed by me) No new EKGs  Telemetry: (personally reviewed by me) Sinus rhythm/sinus bradycardia   Impression:  Persistent atrial fibrillation, currently SR Long-term oral anticoagulation. Long-term antiarrhythmic medications. Chronic heart failure with mildly reduced LVEF, stage C, NYHA class II Hypertension. Hyperlipidemia. Acute kidney injury on chronic kidney disease-likely secondary to diuresis.Improving     Recommendations:  Persistent atrial fibrillation Long-term oral anticoagulation Long-term antiarrhythmics Currently sinus rhythm. Recently underwent direct-current cardioversion. Rate control: N/A, bradycardia mostly on telemetry  Rhythm control: Amiodarone . Thromboembolic prophylaxis: Eliquis  Antiarrhythmic history: Flecainide  (discontinued secondary to change in LVEF) Currently on oral amiodarone  load.  Will start 200 mg p.o. twice daily as mentioned above for 2 week thereafter 200 mg p.o. daily  Patient and her daughter are aware that amiodarone  will require follow-up labs to monitor thyroid  function, LFTs, lung function. Click Here to Calculate/Change CHADS2VASc Score The patient's CHADS2-VASc score is 5, indicating a 7.2% annual risk of stroke. CHF History: Yes HTN History: Yes Diabetes History: No Stroke History: No Vascular Disease History: No Risks, benefits, alternatives were anticoagulation discussed.  Recommend checking H&H and BMP every 6 months to monitor for anemia and renal function I suspect that she will need a low-dose AV nodal blocking agents once the amiodarone  load is complete.  However given her controlled rate we will hold off on AV nodal blocking agents for now and reevaluate as outpatient.   Acute on Chronic heart failure with mildly reduced LVEF, stage C, NYHA class II Improving. Likely secondary to  underlying A-fib/tachycardia Has been diuresed very well with IV Lasix; however, developed acute kidney injury. Therefore currently diuretics and GDMT are being held Renal function slowly improving Restarting low dose losartan  25mg  po qday Restarting lasix prn  Would recommend close outpatient follow-up for uptitration of GDMT Clinically not volume overloaded. BNP levels have significantly improved. Recommend incentive spirometer at bedside - helping clinically.  Recommend  outpatient sleep study   Acute kidney injury: Improving Likely secondary to diuresis Discontinued IV Lasix restarted prn lasix  Monitor BUN and creatinine See above   Hypertension with heart failure chronic kidney disease stage III: Monitor BUN and creatinine, avoid nephrotoxic agents, renal dose medications when appropriate.  Anticipate d/c in next 24hrs.   Further recommendations to follow as the case evolves.   This note was created using a voice recognition software as a result there may be grammatical errors inadvertently enclosed that do not reflect the nature of this encounter. Every attempt is  made to correct such errors.   Olinda Bertrand, DO, [facility]  [facility]  [address] [place], [place] [address] Pager: [phone] Office: [phone] 03/12/2024 1:22 PM

## 2024-03-12 NOTE — Plan of Care (Signed)
  Problem: Health Behavior/Discharge Planning: Goal: Ability to manage health-related needs will improve Outcome: Progressing   Problem: Clinical Measurements: Goal: Ability to maintain clinical measurements within normal limits will improve Outcome: Progressing   Problem: Activity: Goal: Capacity to carry out activities will improve Outcome: Progressing

## 2024-03-12 NOTE — Progress Notes (Signed)
 Physical Therapy Treatment Patient Details Name: Kristin Hancock MRN: [id number] DOB: [birth year] Today's Date: 03/12/2024   History of Present Illness Pt is a 89 y/o female admitted 6/14 with progressively worsening SOB due to acute on chronic CHF and return of afib post recent cardioversion.  PMHx  afib, HTN    PT Comments  Pt greeted supine in bed, pleasant and agreeable to PT session. She engaged in stair training ascending/descending a flight of stairs with bilateral railings and CGA. She increased her gait distance ambulating ~247ft back to her room using RW with CGA. Cued PLB technique throughout. Pt maintained SpO2 88-89% with activity. Reviewed incentive spirometer technique and instructed her to perform 10 reps every hour. Will continue to follow acutely and advance appropriately.      If plan is discharge home, recommend the following: Assist for transportation;Help with stairs or ramp for entrance   Can travel by private vehicle        Equipment Recommendations  None recommended by PT    Recommendations for Other Services       Precautions / Restrictions Precautions Precautions: Fall Recall of Precautions/Restrictions: Intact Restrictions Weight Bearing Restrictions Per Provider Order: No     Mobility  Bed Mobility Overal bed mobility: Needs Assistance Bed Mobility: Supine to Sit     Supine to sit: Supervision     General bed mobility comments: Pt sat up on R side of bed by going into long sitting prior to walking BLE off EOB. She scooted fwd to EOB with BUE support.    Transfers Overall transfer level: Needs assistance Equipment used: Rolling walker (2 wheels) Transfers: Sit to/from Stand, Bed to chair/wheelchair/BSC Sit to Stand: Supervision   Step pivot transfers: Supervision       General transfer comment: Pt stood from lowest bed height. She demonstrated proper hand placement using RW. She performed bed>chair transfer by pivoting around in the  room. Good eccentric control with sitting.    Ambulation/Gait Ambulation/Gait assistance: Contact guard assist, +2 safety/equipment (chair follow provided by son, not required) Gait Distance (Feet): 250 Feet Assistive device: Rolling walker (2 wheels) Gait Pattern/deviations: Step-through pattern Gait velocity: reduced Gait velocity interpretation: <1.8 ft/sec, indicate of risk for recurrent falls   General Gait Details: Pt ambulated with a reciprocal gait pattern, even weight shift, and good foot clearence. She maintained an upright posture and good positioning within RW. Pt manuevered within the hallway without LOB. She did not require a seated or standing rest break. Cued PLB technique.   Stairs Stairs: Yes Stairs assistance: Contact guard assist Stair Management: Two rails, Forwards, Step to pattern Number of Stairs: 10 General stair comments: Pt ascended leading with RLE and descended leading with LLE. She utilized BUE support on rails. No rest break required. Cued PLB technique throughout.   Wheelchair Mobility     Tilt Bed    Modified Rankin (Stroke Patients Only)       Balance Overall balance assessment: Mild deficits observed, not formally tested                                          Communication Communication Communication: No apparent difficulties  Cognition Arousal: Alert Behavior During Therapy: WFL for tasks assessed/performed   PT - Cognitive impairments: No apparent impairments  Following commands: Intact      Cueing Cueing Techniques: Verbal cues  Exercises Other Exercises Other Exercises: Reviewed incentive spirometer education and instructed pt to perform 10 reps every hour.    General Comments General comments (skin integrity, edema, etc.): VSS on RA. SpO2 95% at rest and 88-89% with activity. Son, Rob, present and supportive throughout session      Pertinent Vitals/Pain Pain  Assessment Pain Assessment: No/denies pain    Home Living                          Prior Function            PT Goals (current goals can now be found in the care plan section) Acute Rehab PT Goals Patient Stated Goal: Return Home Progress towards PT goals: Progressing toward goals    Frequency    Min 2X/week      PT Plan      Co-evaluation              AM-PAC PT 6 Clicks Mobility   Outcome Measure  Help needed turning from your back to your side while in a flat bed without using bedrails?: None Help needed moving from lying on your back to sitting on the side of a flat bed without using bedrails?: A Little Help needed moving to and from a bed to a chair (including a wheelchair)?: A Little Help needed standing up from a chair using your arms (e.g., wheelchair or bedside chair)?: A Little Help needed to walk in hospital room?: A Little Help needed climbing 3-5 steps with a railing? : A Little 6 Click Score: 19    End of Session Equipment Utilized During Treatment: Gait belt Activity Tolerance: Patient tolerated treatment well Patient left: in chair;with call bell/phone within reach;with family/visitor present Nurse Communication: Mobility status PT Visit Diagnosis: Muscle weakness (generalized) (M62.81);Difficulty in walking, not elsewhere classified (R26.2)     Time: 4098-1191 PT Time Calculation (min) (ACUTE ONLY): 30 min  Charges:    $Gait Training: 23-37 mins PT General Charges $$ ACUTE PT VISIT: 1 Visit                     Glenford Lanes, PT, DPT Acute Rehabilitation Services Office: [phone] Secure Chat Preferred  Riva Chester 03/12/2024, 8:39 AM

## 2024-03-12 NOTE — Care Management Important Message (Signed)
 Important Message  Patient Details  Name: Kristin Hancock MRN: [id number] Date of Birth: 02-10-1931   Important Message Given:  Yes - Medicare IM     Janith Melnick 03/12/2024, 11:25 AM

## 2024-03-13 DIAGNOSIS — I5023 Acute on chronic systolic (congestive) heart failure: Secondary | ICD-10-CM | POA: Diagnosis not present

## 2024-03-13 DIAGNOSIS — I130 Hypertensive heart and chronic kidney disease with heart failure and stage 1 through stage 4 chronic kidney disease, or unspecified chronic kidney disease: Secondary | ICD-10-CM | POA: Diagnosis not present

## 2024-03-13 DIAGNOSIS — I5021 Acute systolic (congestive) heart failure: Secondary | ICD-10-CM | POA: Diagnosis not present

## 2024-03-13 DIAGNOSIS — N179 Acute kidney failure, unspecified: Secondary | ICD-10-CM | POA: Diagnosis not present

## 2024-03-13 DIAGNOSIS — I4819 Other persistent atrial fibrillation: Secondary | ICD-10-CM | POA: Diagnosis not present

## 2024-03-13 LAB — BASIC METABOLIC PANEL WITH GFR
Anion gap: 13 (ref 5–15)
BUN: 19 mg/dL (ref 8–23)
CO2: 21 mmol/L — ABNORMAL LOW (ref 22–32)
Calcium: 8.8 mg/dL — ABNORMAL LOW (ref 8.9–10.3)
Chloride: 105 mmol/L (ref 98–111)
Creatinine, Ser: 1.01 mg/dL — ABNORMAL HIGH (ref 0.44–1.00)
GFR, Estimated: 53 mL/min — ABNORMAL LOW (ref >60)
Glucose, Bld: 102 mg/dL — ABNORMAL HIGH (ref 70–99)
Potassium: 4.1 mmol/L (ref 3.5–5.1)
Sodium: 139 mmol/L (ref 135–145)

## 2024-03-13 LAB — MAGNESIUM: Magnesium: 2.2 mg/dL (ref 1.7–2.4)

## 2024-03-13 MED ORDER — METOPROLOL SUCCINATE ER 25 MG PO TB24
25.0000 mg | ORAL_TABLET | Freq: Every day | ORAL | Status: DC
  Filled 2024-03-13: qty 1

## 2024-03-13 MED ORDER — METOPROLOL SUCCINATE ER 25 MG PO TB24: 25.0000 mg | ORAL_TABLET | Freq: Every day | ORAL | 0 refills | Status: DC

## 2024-03-13 MED ORDER — FUROSEMIDE 20 MG PO TABS: 20.0000 mg | ORAL_TABLET | Freq: Every day | ORAL | 0 refills | Status: DC | PRN

## 2024-03-13 MED ORDER — LOSARTAN POTASSIUM 25 MG PO TABS: 25.0000 mg | ORAL_TABLET | Freq: Every day | ORAL | 0 refills | Status: DC

## 2024-03-13 MED ORDER — AMIODARONE HCL 200 MG PO TABS: ORAL_TABLET | ORAL | 0 refills | Status: DC

## 2024-03-13 NOTE — Progress Notes (Signed)
 Mobility Specialist Progress Note:   03/13/24 1119  Mobility  Activity Ambulated with assistance in hallway  Level of Assistance Contact guard assist, steadying assist  Assistive Device Front wheel walker  Distance Ambulated (ft) 250 ft  Activity Response Tolerated well  Mobility Referral Yes  Mobility visit 1 Mobility  Mobility Specialist Start Time (ACUTE ONLY) 1119  Mobility Specialist Stop Time (ACUTE ONLY) 1130  Mobility Specialist Time Calculation (min) (ACUTE ONLY) 11 min   Pt agreeable to mobility session. Required only minG assist to ambulation. VSS on RA throughout. Pt back in chair with all needs met.   Oneda Big Mobility Specialist Please contact via SecureChat or  Rehab office at [phone]

## 2024-03-13 NOTE — Progress Notes (Signed)
 Progress Note  Patient Name: Kristin Hancock Date of Encounter: 03/13/2024 [facility] Cardiologist: Marven Slimmer  Interval Summary   No events overnight. Denies anginal chest pain or heart failure symptoms. Remains in sinus rhythm with a higher ventricular rate. Son at bedside  Vital Signs Vitals:   03/13/24 0028 03/13/24 0528 03/13/24 0740 03/13/24 1115  BP: (!) 126/53 (!) 124/57 139/60 123/63  Pulse: 86 76 79 88  Resp: 17 19 17 19   Temp: 98 F (36.7 C) 98.2 F (36.8 C) 98 F (36.7 C) 98.8 F (37.1 C)  TempSrc: Oral Oral Oral Oral  SpO2: 95% 94% 99% 96%  Weight:  73.9 kg    Height:        Intake/Output Summary (Last 24 hours) at 03/13/2024 1139 Last data filed at 03/13/2024 0758 Gross per 24 hour  Intake 1017 ml  Output 900 ml  Net 117 ml      03/13/2024    5:28 AM 03/12/2024    4:57 AM 03/11/2024    4:49 AM  Last 3 Weights  Weight (lbs) 163 lb 165 lb 4.8 oz 166 lb 9.6 oz  Weight (kg) 73.936 kg 74.98 kg 75.569 kg     Telemetry/ECG  Sinus rhythm, HR 60s - Personally Reviewed  Physical Exam  Constitutional: No distress.  hemodynamically stable  Neck: No JVD present.  Cardiovascular: Normal rate, regular rhythm, S1 normal and S2 normal. Exam reveals no gallop, no S3 and no S4.  No murmur heard. Pulmonary/Chest: Effort normal. No stridor. She has no wheezes. She has no rales.  Musculoskeletal:        General: No edema.     Cervical back: Neck supple.  Skin: Skin is warm.    EKG: (personally reviewed by me) No new EKGs  Telemetry: (personally reviewed by me) Sinus rhythm/sinus bradycardia   Impression:  Persistent atrial fibrillation, currently SR Long-term oral anticoagulation. Long-term antiarrhythmic medications. Chronic heart failure with mildly reduced LVEF, stage C, NYHA class II Hypertension. Hyperlipidemia. Acute kidney injury on chronic kidney disease-likely secondary to diuresis.Improving/almost back to baseline      Recommendations:  Persistent atrial fibrillation Long-term oral anticoagulation Long-term antiarrhythmics Currently sinus rhythm. Recently underwent direct-current cardioversion. Rate control: Start Toprol-XL 25 mg p.o. daily Rhythm control: Amiodarone . Thromboembolic prophylaxis: Eliquis  Antiarrhythmic history: Flecainide  (discontinued secondary to change in LVEF) Currently on oral amiodarone  load.  Will start 200 mg p.o. twice daily as mentioned above for 2 week thereafter 200 mg p.o. daily  Patient and her daughter are aware that amiodarone  will require follow-up labs to monitor thyroid  function, LFTs, lung function. Click Here to Calculate/Change CHADS2VASc Score The patient's CHADS2-VASc score is 5, indicating a 7.2% annual risk of stroke. CHF History: Yes HTN History: Yes Diabetes History: No Stroke History: No Vascular Disease History: No Risks, benefits, alternatives were anticoagulation discussed.  Recommend checking H&H and BMP every 6 months to monitor for anemia and renal function. Patient started ambulating on the floors with support staff and her ventricular rate over the last 24 hours is trending up.  At home she is on atenolol  50 mg p.o. daily.  Given her history of A-fib we will start her on Toprol-XL 25 mg p.o. daily the dose can be uptitrated as outpatient.   Acute on Chronic heart failure with mildly reduced LVEF, stage C, NYHA class II Compensated/euvolemic. Likely secondary to underlying A-fib/tachycardia BNP levels have significantly improved. Has been diuresed very well with IV Lasix; however, developed acute kidney injury. Patient monitored in the  hospital for longer than anticipated to make sure renal function improves. GDMT has been held secondary to renal function. Is started on losartan  25 mg p.o. daily 03/12/2024 which she is tolerated well and renal function remained stable. Continue Lasix, as needed basis. Continue current dose of losartan  25 mg p.o.  daily Remainder of the GDMT can be uptitrated as outpatient.  Has an upcoming appointment in July 2025. Continue incentive spirometer  Recommend  outpatient sleep study   Acute kidney injury: Improving/resolved Baseline creatinine 0.9 mg/dL, this morning 1.61 Likely secondary to diuresis Discontinued IV Lasix restarted prn lasix  Continue low-dose losartan  Monitor BUN and creatinine See above   Hypertension with heart failure chronic kidney disease stage III: Monitor BUN and creatinine, avoid nephrotoxic agents, renal dose medications when appropriate.  Cardiology will sign off. Medication recommendations as mentioned above.   Remainder will be deferred to primary team Outpatient follow-up arranged for April 04, 2024 and plan of care discussed with patient, son during rounds.  Reviewed labs from 03/13/2024, telemetry independently reviewed, prior progress notes over the last 24 hours reviewed, prescription drug management as discussed above, and coordination of care and follow-up arranged  Further recommendations to follow as the case evolves.   This note was created using a voice recognition software as a result there may be grammatical errors inadvertently enclosed that do not reflect the nature of this encounter. Every attempt is made to correct such errors.   Olinda Bertrand, DO, [facility]  [address] [place], [place] [address] Pager: [phone] Office: [phone] 03/13/2024 11:39 AM

## 2024-03-13 NOTE — Discharge Summary (Signed)
 Physician Discharge Summary   Patient: Kristin Hancock MRN: [id number] DOB: 1931-01-13  Admit date:     03/08/2024  Discharge date: 03/13/24  Discharge Physician: Veronica Gordon   PCP: Elester Grim, MD   Recommendations at discharge:   Follow-up with PCP in 1 week Follow-up with cardiology as scheduled  Discharge Diagnoses: Principal Problem:   Acute on chronic systolic CHF (congestive heart failure) (HCC) Active Problems:   Persistent atrial fibrillation (HCC)   HTN (hypertension)   HLD (hyperlipidemia)   AKI (acute kidney injury) (HCC)   Acute heart failure with mildly reduced ejection fraction (HFmrEF, 41-49%) (HCC)   Hypertensive heart and kidney disease with HF and with CKD stage III (HCC)   Leukocytosis    Hospital Course: Patient with PMH of persistent A-fib, chronic systolic CHF, HTN, HLD, obesity presented to the hospital with complaints of progressively worsening shortness of breath. Currently being treated for CHF exacerbation.    Today, patient denies any new complaints, able to ambulate without any issues.  Denies any chest pain, abdominal pain, nausea/vomiting, fever/chills.  Discussed extensively with son at bedside, all questions answered.  Assessment and Plan:  Acute on chronic systolic CHF BNP 086 Recent echocardiogram as above showed EF of 45 to 50%, mild MS, mild TR Cardiology consulted, appreciate recs Lasix to be given as needed, reduced losartan  to 25 mg daily, started metoprolol, further medication titration by cardiology Follow-up with cardiology   Persistent atrial fibrillation  Recently underwent cardioversion Cardiology consulted Continue amiodarone , Eliquis    AKI Possibly likely from overdiuresis Serum creatinine peaked at 1.56 and now trended down to baseline   HTN  BP stable Discontinued HCTZ, atenolol , amlodipine  Continue losartan , metoprolol   HLD  On statin   Goals of care conversation Discussed in details with  regards to her goals of care. Wants to be full code Wants her daughter to be her HCPOA   Nocturnal hypoxia Likely sleep apnea-patient will need a sleep study done outpatient Oxygenation drops to 80s% at nighttime.  Remains below 89% for 55 minutes at nighttime 2 LPM oxygen arranged on discharge to be used nightly         Consultants: Cardiology Procedures performed: None Disposition: Home health Diet recommendation:  Cardiac diet   DISCHARGE MEDICATION: Allergies as of 03/13/2024   No Known Allergies      Medication List     STOP taking these medications    amLODipine  5 MG tablet Commonly known as: NORVASC    atenolol  50 MG tablet Commonly known as: TENORMIN    hydrochlorothiazide  12.5 MG tablet Commonly known as: HYDRODIURIL        TAKE these medications    acetaminophen 500 MG tablet Commonly known as: TYLENOL Take 1,000 mg by mouth 2 (two) times daily as needed for moderate pain (pain score 4-6), fever or headache.   amiodarone  200 MG tablet Commonly known as: PACERONE  Take 1 tablet (200 mg total) by mouth 2 (two) times daily for 14 days, THEN 1 tablet (200 mg total) daily. Start taking on: March 13, 2024 What changed: See the new instructions.   CALCIUM + VITAMIN D3 PO Take 1 tablet by mouth daily.   Eliquis  5 MG Tabs tablet Generic drug: apixaban  TAKE 1 TABLET BY MOUTH TWICE  DAILY   ferrous sulfate 325 (65 FE) MG tablet Take 325 mg by mouth daily.   furosemide 20 MG tablet Commonly known as: LASIX Take 1 tablet (20 mg total) by mouth daily as needed for fluid (Increase in  the 1 pound over 24 hours or 3 pounds over a week).   Izervay 2 MG/0.1ML Soln Generic drug: Avacincaptad Pegol 1 mg by Intravitreal route every 2 (two) months.   losartan  25 MG tablet Commonly known as: COZAAR  Take 1 tablet (25 mg total) by mouth daily. Start taking on: March 14, 2024 What changed:  medication strength how much to take   metoprolol succinate 25 MG 24  hr tablet Commonly known as: TOPROL-XL Take 1 tablet (25 mg total) by mouth daily.   PreserVision AREDS 2 Caps Take 1 capsule by mouth 2 (two) times daily.   simvastatin  20 MG tablet Commonly known as: ZOCOR  TAKE 1 TABLET BY MOUTH DAILY               Durable Medical Equipment  (From admission, onward)           Start     Ordered   03/13/24 1217  For home use only DME oxygen  Once       Question Answer Comment  Length of Need 6 Months   Mode or (Route) Nasal cannula   Liters per Minute 2   Frequency Only at night (stationary unit needed)   Oxygen delivery system Gas      03/13/24 1217            Follow-up Information     Pahwani, Regino Caprio, MD. Call.   Specialty: Internal Medicine Why: Call office to set up hospital follow up Contact information: [address] [place] [address] [phone]         Care, [facility] Follow up.   Specialty: Home Health Services Why: Agency will call you to set up apt times Contact information: [address] [place] [address] [phone]                Discharge Exam: Filed Weights   03/11/24 0449 03/12/24 0457 03/13/24 0528  Weight: 75.6 kg 75 kg 73.9 kg   General: NAD  Cardiovascular: S1, S2 present Respiratory: CTAB Abdomen: Soft, nontender, nondistended, bowel sounds present Musculoskeletal: No bilateral pedal edema noted Skin: Normal Psychiatry: Normal mood   Condition at discharge: stable  The results of significant diagnostics from this hospitalization (including imaging, microbiology, ancillary and laboratory) are listed below for reference.   Imaging Studies: DG Chest 2 View Result Date: 03/09/2024 CLINICAL DATA:  Heart failure EXAM: CHEST - 2 VIEW COMPARISON:  11/24/2021 FINDINGS: The heart is top-normal in size.  Thoracic aortic atherosclerosis. Very mild increased interstitial markings, suggesting mild interstitial edema. Mild right basilar  atelectasis. Possible small bilateral pleural effusions. IMPRESSION: Very mild increased interstitial markings, suggesting mild interstitial edema. Possible small bilateral pleural effusions. Electronically Signed   By: Zadie Herter M.D.   On: 03/09/2024 20:35   US  Venous Img Lower Unilateral Left Result Date: 03/08/2024 CLINICAL DATA:  Left leg swelling, short of breath, recent cardioversion EXAM: LEFT LOWER EXTREMITY VENOUS DOPPLER ULTRASOUND TECHNIQUE: Gray-scale sonography with compression, as well as color and duplex ultrasound, were performed to evaluate the deep venous system(s) from the level of the common femoral vein through the popliteal and proximal calf veins. COMPARISON:  None Available. FINDINGS: VENOUS Normal compressibility of the common femoral, superficial femoral, and popliteal veins, as well as the visualized calf veins. Visualized portions of profunda femoral vein and great saphenous vein unremarkable. No filling defects to suggest DVT on grayscale or color Doppler imaging. Doppler waveforms show normal direction of venous flow,  normal respiratory plasticity and response to augmentation. Limited views of the contralateral common femoral vein are unremarkable. OTHER None. Limitations: none IMPRESSION: 1. No evidence of deep venous thrombosis within the left lower extremity. Electronically Signed   By: Bobbye Burrow M.D.   On: 03/08/2024 13:53   CT Angio Chest PE W and/or Wo Contrast Result Date: 03/08/2024 CLINICAL DATA:  Weakness, short of breath, recent cardioversion EXAM: CT ANGIOGRAPHY CHEST WITH CONTRAST TECHNIQUE: Multidetector CT imaging of the chest was performed using the standard protocol during bolus administration of intravenous contrast. Multiplanar CT image reconstructions and MIPs were obtained to evaluate the vascular anatomy. RADIATION DOSE REDUCTION: This exam was performed according to the departmental dose-optimization program which includes automated exposure  control, adjustment of the mA and/or kV according to patient size and/or use of iterative reconstruction technique. CONTRAST:  75mL OMNIPAQUE IOHEXOL 350 MG/ML SOLN COMPARISON:  None Available. FINDINGS: Cardiovascular: This is a technically adequate evaluation of the pulmonary vasculature. No filling defects or pulmonary emboli. Mild cardiomegaly without pericardial effusion. Calcifications of the mitral and aortic valve. Normal caliber of the thoracic aorta. Atherosclerosis of the aorta and coronary vasculature. Mediastinum/Nodes: Mediastinal lymphadenopathy, measuring 17 mm in the precarinal region image 55/4, and 17 mm in the subcarinal region image 73/4. No hilar or axillary adenopathy. Thyroid , trachea, and esophagus appear unremarkable. Lungs/Pleura: There are small bilateral pleural effusions, right greater than left. Mild diffuse interlobular septal thickening, greatest at the lung bases, consistent with interstitial edema. No acute airspace disease or pneumothorax. Central airways are patent. Upper Abdomen: Partial visualization of calcified gallstones dependent within the gallbladder. No evidence of acute cholecystitis. Musculoskeletal: No acute or destructive bony abnormalities. Reconstructed images demonstrate no additional findings. Review of the MIP images confirms the above findings. IMPRESSION: 1. No evidence of pulmonary embolus. 2. Cardiomegaly, small bilateral pleural effusions, and interlobular septal thickening most consistent with mild congestive heart failure and developing interstitial edema. 3. Nonspecific mediastinal lymphadenopathy, measuring up to 17 mm as above. 4. Incidental cholelithiasis without evidence of cholecystitis. 5. Aortic Atherosclerosis (ICD10-I70.0). Coronary artery atherosclerosis. Electronically Signed   By: Bobbye Burrow M.D.   On: 03/08/2024 13:35   EP STUDY Result Date: 03/05/2024 See surgical note for result.   Microbiology: Results for orders placed or  performed during the hospital encounter of 03/08/24  Respiratory (~20 pathogens) panel by PCR     Status: None   Collection Time: 03/10/24  9:26 AM   Specimen: Nasopharyngeal Swab; Respiratory  Result Value Ref Range Status   Adenovirus NOT DETECTED NOT DETECTED Final   Coronavirus 229E NOT DETECTED NOT DETECTED Final    Comment: (NOTE) The Coronavirus on the Respiratory Panel, DOES NOT test for the novel  Coronavirus (2019 nCoV)    Coronavirus HKU1 NOT DETECTED NOT DETECTED Final   Coronavirus NL63 NOT DETECTED NOT DETECTED Final   Coronavirus OC43 NOT DETECTED NOT DETECTED Final   Metapneumovirus NOT DETECTED NOT DETECTED Final   Rhinovirus / Enterovirus NOT DETECTED NOT DETECTED Final   Influenza A NOT DETECTED NOT DETECTED Final   Influenza B NOT DETECTED NOT DETECTED Final   Parainfluenza Virus 1 NOT DETECTED NOT DETECTED Final   Parainfluenza Virus 2 NOT DETECTED NOT DETECTED Final   Parainfluenza Virus 3 NOT DETECTED NOT DETECTED Final   Parainfluenza Virus 4 NOT DETECTED NOT DETECTED Final   Respiratory Syncytial Virus NOT DETECTED NOT DETECTED Final   Bordetella pertussis NOT DETECTED NOT DETECTED Final   Bordetella Parapertussis NOT DETECTED NOT DETECTED  Final   Chlamydophila pneumoniae NOT DETECTED NOT DETECTED Final   Mycoplasma pneumoniae NOT DETECTED NOT DETECTED Final    Comment: Performed at [facility], [address], [place], [place] [address]    Labs: CBC: Recent Labs  Lab 03/08/24 1111 03/09/24 0246 03/10/24 0209 03/11/24 0225  WBC 7.9 14.5* 17.1* 10.2  NEUTROABS  --   --   --  7.6  HGB 11.1* 11.9* 10.1* 10.4*  HCT 34.5* 36.5 30.5* 32.2*  MCV 84.4 84.1 83.3 83.6  PLT 182 189 159 162   Basic Metabolic Panel: Recent Labs  Lab 03/08/24 1754 03/09/24 0246 03/09/24 1250 03/10/24 0757 03/11/24 0225 03/12/24 0231 03/13/24 0236  NA 141   < > 138 136 136 137 139  K 3.8   < > 3.9 3.6 3.8 3.8 4.1  CL 107   < > 101 100 99 101 105  CO2 23   < >  24 22 25 27  21*  GLUCOSE 106*   < > 153* 90 106* 107* 102*  BUN 42*   < > 38* 39* 41* 30* 19  CREATININE 1.14*   < > 1.31* 1.56* 1.47* 1.15* 1.01*  CALCIUM 9.3   < > 9.0 8.3* 8.1* 8.4* 8.8*  MG 1.8  --   --   --  1.8 2.0 2.2   < > = values in this interval not displayed.   Liver Function Tests: No results for input(s): AST, ALT, ALKPHOS, BILITOT, PROT, ALBUMIN in the last 168 hours. CBG: No results for input(s): GLUCAP in the last 168 hours.  Discharge time spent: greater than 30 minutes.  Signed: Veronica Gordon, MD [facility] 03/13/2024

## 2024-03-13 NOTE — TOC Transition Note (Addendum)
 Transition of Care [facility]) - Discharge Note   Patient Details  Name: Kristin Hancock MRN: [id number] Date of Birth: November 05, 1930  Transition of Care [facility]) CM/SW Contact:  Jennett Model, RN Phone Number: 03/13/2024, 11:06 AM   Clinical Narrative:    For possible dc today, she is set up with [facility] for HHPT , will need orders. She has transportation at [employer].  NCM faxed the over night pulse ox to Jermaine with [employer].  Patient is ok with [employer] providing over night oxygen for her.  Will need oxygen order.         Patient Goals and CMS Choice            Discharge Placement                       Discharge Plan and Services Additional resources added to the After Visit Summary for                                       Social Drivers of Health (SDOH) Interventions SDOH Screenings   Food Insecurity: No Food Insecurity (03/08/2024)  Housing: Low Risk  (03/08/2024)  Transportation Needs: No Transportation Needs (03/08/2024)  Utilities: Not At Risk (03/08/2024)  Financial Resource Strain: High Risk (01/07/2020)   Received from [facility]  Physical Activity: Insufficiently Active (01/07/2020)   Received from [facility]  Social Connections: Moderately Integrated (03/08/2024)  Stress: No Stress Concern Present (01/07/2020)   Received from [facility]  Tobacco Use: Low Risk  (03/08/2024)     Readmission Risk Interventions    03/10/2024    2:21 PM  Readmission Risk Prevention Plan  Post Dischage Appt Complete  Medication Screening Complete  Transportation Screening Complete

## 2024-04-01 NOTE — Progress Notes (Signed)
 Cardiology Office Note:  .   Date:  04/01/2024  ID:  Kristin Hancock, DOB 1931/06/18, MRN [id number] PCP: Vernon Velna SAUNDERS, MD  [facility] Providers Cardiologist:  None Electrophysiologist:  OLE ONEIDA HOLTS, MD {  History of Present Illness: .   Kristin Hancock is a 89 y.o. female w/PMHx of  HTN, HLD AFib  Saw Dr. HOLTS 06/29/23, doing well, no Afib, cutting flecainide  (for75mg  dose) was hard >> dose reduced to 50mg  BID Planned for 6 mo APP visit  I saw her 12/26/23 She reports DOE Walking room to room even in her house gets her winded She is fairly sedentary perhaps primarily 2/2 her b/l knee and back arthritis DOE is not necessarily new or changing but worried her EKG w/stable intervals Did not appear volume OL, but planned for an echo   LVEF 45-50% Given this her flecainide  stopped (Dr. HOLTS was in agreement) And recommended to come in to review medications   I saw her 03/04/24 She is accompanied by her daughter today By checking her daily BP/vitals she has observed that her HR has been steadily in the 120's since about May 20th No cardiac awareness No CP Since then perhaps a little more winded, her daughter adds infrequently but mentioned some slight dizziness. No rest SOB or symptoms of PND/orthopnea No near syncope or syncope She reports excellent medication compliance, never misses her Eliquis  and is certain she has not missed any in 3 weeks (if ever)  She was in AFlutter 129bpm, stable BP, mild SOB, otherwise no symptoms Started on amiodarone  > planned for DCCV the following day Pending visit with gen cards to get associated with that team   DCCV 03/05/24 successful  ADMITTED 03/08/24 reported weakness, dizzy at home, low BPs and HRs Found volume OL > IV lasix  AKI and monitored until imroved Discharged 03/13/24 on  BB, ARB PRN lasix  Recs for outpt sleep study  Today's visit is scheduled to follow up on echo and med  changes ROS:   She is accompanied by her son today  She feels well outside of knee pain R knee started to hurt with the addition of PT in/after the hospital No CP, palpitations or cardiac awareness No SOB No edema No need for PRN lasix  Tolerating her meds well No bleeding or signs of bleeding  Home BP and HRs have been stable and good, Hrs generally 60's or so, none >100   Arrhythmia/AAD hx Afib goes back at least to 2021 (likely prior to that having been treated previously in [place] in March 2022 already on flecainide ) Flecainide  > dose increase march 2022 >> reduced back to 50mg  BID Oct 2024 2/2 difficulty cutting tabs Flecainide  stopped May 2025 2/2 abnormal echo/reduced LVEF Amiodarone  started June 2025 with AFlutter  Studies Reviewed: SABRA    EKG not done today  01/29/24: TTE 1. Left ventricular ejection fraction, by estimation, is 45 to 50%. The  left ventricle has mildly decreased function. The left ventricle  demonstrates global hypokinesis. Left ventricular diastolic function could  not be evaluated.   2. Right ventricular systolic function is mildly reduced. The right  ventricular size is normal. There is moderately elevated pulmonary artery  systolic pressure. The estimated right ventricular systolic pressure is  49.0 mmHg.   3. Left atrial size was mild to moderately dilated.   4. The mitral valve is degenerative. Trivial mitral valve regurgitation.  Mild mitral stenosis. The mean mitral valve gradient is 6.3 mmHg with  average heart rate of 99 bpm. Moderate mitral annular calcification.   5. Tricuspid valve regurgitation is mild to moderate.   6. The aortic valve is tricuspid. There is mild calcification of the  aortic valve. There is mild thickening of the aortic valve. Aortic valve  regurgitation is not visualized. Aortic valve sclerosis/calcification is  present, without any evidence of  aortic stenosis.   7. The inferior vena cava is normal in size with  greater than 50%  respiratory variability, suggesting right atrial pressure of 3 mmHg.    2016 scanned echo with normal LVEF  Risk Assessment/Calculations:    Physical Exam:   VS:  LMP  (LMP Unknown)    Wt Readings from Last 3 Encounters:  03/13/24 163 lb (73.9 kg)  03/04/24 171 lb (77.6 kg)  12/26/23 165 lb 6.4 oz (75 kg)    GEN: Well nourished, well developed in no acute distress NECK: No JVD; No carotid bruits CARDIAC: RRR, no murmurs, rubs, gallops RESPIRATORY: CTA b/l without rales, wheezing or rhonchi  ABDOMEN: Soft, non-tender, non-distended EXTREMITIES: no edema; No deformity   ASSESSMENT AND PLAN: .    paroxysmal AFib CHA2DS2Vasc is 4, on Eliquis , appropriately dosed (weight/creat) Amiodarone  Surveillance labs today   HTN Looks OK  Secondary hypercoagulable state 2/2 AFib  4. Recent acute HF exacerbation LVEF 45-50% Likely provoked by RVR No symptoms or exam findings of volume OL Will get her back on track to establish with our gen cards team      Dispo: cardiology team at next available, otherwise with EP as scheduled, sooner if needed  Signed, Charlies Macario Arthur, PA-C

## 2024-04-02 ENCOUNTER — Telehealth: Payer: Self-pay | Admitting: Cardiology

## 2024-04-02 NOTE — Telephone Encounter (Signed)
  Care, [facility] Follow up.   Specialty: Home Health Services Why: Agency will call you to set up apt times Contact information: [address] [place] [address] [phone]    Spoke with patient who reports the last 2 nights her oxygen monitor has alarmed and red lights are coming on.  The only thing she is able to see is the word, no.  She is unsure of which agency supplied her oxygen and reports the person who left it was in a hurry to get in and out and didn't leave her any information.  Provided pt with the above information found in pt's discharge summary.  She will contact the company with her concerns and call back if further needs. She was appreciative of the call back and information.

## 2024-04-02 NOTE — Telephone Encounter (Signed)
 Pt would like a number to contact the office who handles the Oxygen machine she was provided with after leaving the hospital due to it not working. Please advise

## 2024-04-04 ENCOUNTER — Encounter: Payer: Self-pay | Admitting: Physician Assistant

## 2024-04-04 ENCOUNTER — Ambulatory Visit: Attending: Physician Assistant | Admitting: Physician Assistant

## 2024-04-04 VITALS — BP 130/70 | HR 68 | Ht 62.0 in | Wt 162.2 lb

## 2024-04-04 DIAGNOSIS — I420 Dilated cardiomyopathy: Secondary | ICD-10-CM | POA: Diagnosis not present

## 2024-04-04 DIAGNOSIS — Z79899 Other long term (current) drug therapy: Secondary | ICD-10-CM | POA: Diagnosis not present

## 2024-04-04 DIAGNOSIS — I10 Essential (primary) hypertension: Secondary | ICD-10-CM | POA: Diagnosis not present

## 2024-04-04 DIAGNOSIS — I4819 Other persistent atrial fibrillation: Secondary | ICD-10-CM | POA: Diagnosis not present

## 2024-04-04 MED ORDER — LOSARTAN POTASSIUM 25 MG PO TABS: 25.0000 mg | ORAL_TABLET | Freq: Every day | ORAL | 3 refills | Status: DC

## 2024-04-04 MED ORDER — METOPROLOL SUCCINATE ER 25 MG PO TB24: 25.0000 mg | ORAL_TABLET | Freq: Every day | ORAL | 3 refills | Status: AC

## 2024-04-04 NOTE — Patient Instructions (Addendum)
 Medication Instructions:   Your physician recommends that you continue on your current medications as directed. Please refer to the Current Medication list given to you today.   *If you need a refill on your cardiac medications before your next appointment, please call your pharmacy*   Lab Work:  PLEASE GO DOWN STAIRS  [facility]  FIRST FLOOR   ( GET OFF ELEVATORS WALK TOWARDS WAITING AREA LAB LOCATED BY PHARMACY):  LFT AND TSH TODAY      If you have labs (blood work) drawn today and your tests are completely normal, you will receive your results only by: MyChart Message (if you have MyChart) OR A paper copy in the mail If you have any lab test that is abnormal or we need to change your treatment, we will call you to review the results.    Testing/Procedures: NONE ORDERED  TODAY    Follow-Up:  At [facility], you and your health needs are our priority.  As part of our continuing mission to provide you with exceptional heart care, our providers are all part of one team.  This team includes your primary Cardiologist (physician) and Advanced Practice Providers or APPs (Physician Assistants and Nurse Practitioners) who all work together to provide you with the care you need, when you need it.  Your next appointment:  RESCHEDULE OPENING GENERAL CARDIOLOGIST / APP    NEXT AVAILABLE FOR  NEW PATIENT   Provider:    You may see OLE ONEIDA HOLTS, MD  AS SCHEDULED    We recommend signing up for the patient portal called MyChart.  Sign up information is provided on this After Visit Summary.  MyChart is used to connect with patients for Virtual Visits (Telemedicine).  Patients are able to view lab/test results, encounter notes, upcoming appointments, etc.  Non-urgent messages can be sent to your provider as well.   To learn more about what you can do with MyChart, go to [email or web address].   Other Instructions

## 2024-04-05 LAB — HEPATIC FUNCTION PANEL
ALT: 12 IU/L (ref 0–32)
AST: 23 IU/L (ref 0–40)
Albumin: 4 g/dL (ref 3.6–4.6)
Alkaline Phosphatase: 85 IU/L (ref 44–121)
Bilirubin Total: 0.4 mg/dL (ref 0.0–1.2)
Bilirubin, Direct: 0.2 mg/dL (ref 0.00–0.40)
Total Protein: 7.4 g/dL (ref 6.0–8.5)

## 2024-04-05 LAB — TSH: TSH: 1.15 u[IU]/mL (ref 0.450–4.500)

## 2024-04-07 ENCOUNTER — Ambulatory Visit: Payer: Self-pay | Admitting: Physician Assistant

## 2024-04-22 ENCOUNTER — Ambulatory Visit: Admitting: Physician Assistant

## 2024-05-27 LAB — LAB REPORT - SCANNED: EGFR: 66

## 2024-06-09 NOTE — Progress Notes (Signed)
 Cardiology Office Note:   Date:  06/09/2024  ID:  Leonie Crayton Senior, DOB [birth year], MRN [id number] PCP: Vernon Velna SAUNDERS, MD  [facility] Providers Cardiologist:  None Electrophysiologist:  OLE ONEIDA HOLTS, MD { Chief Complaint: No chief complaint on file.     History of Present Illness:   Kristin Hancock is a 89 y.o. female with a PMH of HTN, HLD, PAF/AFlutter (on Eliquis ) and HFmrEF (EF = 45-50%) who presents for follow up of the request of EP.  Patient was recently hospitalized at [facility] from 6/14-6/19 for decompensated heart failure in the setting of atrial flutter with RVR.  She underwent DCCV on 03/05/2024 which was successful and was diuresed.  She currently follows with EP as an outpatient last seen in clinic on 04/04/2024.  She continues on amiodarone  for rhythm control.   Past Medical History:  Diagnosis Date   Atrial fibrillation (HCC)    Hypercholesterolemia    Hypertension    Overactive bladder      Studies Reviewed:    EKG: ***       Cardiac Studies & Procedures   ______________________________________________________________________________________________   STRESS TESTS  MYOCARDIAL PERFUSION IMAGING 07/05/2015   ECHOCARDIOGRAM  ECHOCARDIOGRAM COMPLETE 01/29/2024  Narrative ECHOCARDIOGRAM REPORT    Patient Name:   SHANESSA HODAK Date of Exam: 01/29/2024 Medical Rec #:  [id number]           Height:       62.0 in Accession #:    [id number]          Weight:       165.4 lb Date of Birth:  1930/12/31           BSA:          1.763 m Patient Age:    89 years            BP:           154/56 mmHg Patient Gender: F                   HR:           90 bpm. Exam Location:  [address]  Procedure: 2D Echo, Cardiac Doppler and Color Doppler (Both Spectral and Color Flow Doppler were utilized during procedure).  Indications:    R06.00 Dyspnea  History:        Patient has prior history of Echocardiogram examinations,  most recent 05/17/2015. Arrythmias:Atrial Fibrillation; Risk Factors:Hypertension and Dyslipidemia.  Sonographer:    Carl Coma RDCS Referring Phys: [id number] RENEE LYNN URSUY  IMPRESSIONS   1. Left ventricular ejection fraction, by estimation, is 45 to 50%. The left ventricle has mildly decreased function. The left ventricle demonstrates global hypokinesis. Left ventricular diastolic function could not be evaluated. 2. Right ventricular systolic function is mildly reduced. The right ventricular size is normal. There is moderately elevated pulmonary artery systolic pressure. The estimated right ventricular systolic pressure is 49.0 mmHg. 3. Left atrial size was mild to moderately dilated. 4. The mitral valve is degenerative. Trivial mitral valve regurgitation. Mild mitral stenosis. The mean mitral valve gradient is 6.3 mmHg with average heart rate of 99 bpm. Moderate mitral annular calcification. 5. Tricuspid valve regurgitation is mild to moderate. 6. The aortic valve is tricuspid. There is mild calcification of the aortic valve. There is mild thickening of the aortic valve. Aortic valve regurgitation is not visualized. Aortic valve sclerosis/calcification is present, without any evidence of aortic stenosis. 7. The inferior vena cava is  normal in size with greater than 50% respiratory variability, suggesting right atrial pressure of 3 mmHg.  FINDINGS Left Ventricle: Left ventricular ejection fraction, by estimation, is 45 to 50%. The left ventricle has mildly decreased function. The left ventricle demonstrates global hypokinesis. The left ventricular internal cavity size was normal in size. There is no left ventricular hypertrophy. Left ventricular diastolic function could not be evaluated due to atrial fibrillation. Left ventricular diastolic function could not be evaluated.  Right Ventricle: The right ventricular size is normal. No increase in right ventricular wall thickness.  Right ventricular systolic function is mildly reduced. There is moderately elevated pulmonary artery systolic pressure. The tricuspid regurgitant velocity is 3.39 m/s, and with an assumed right atrial pressure of 3 mmHg, the estimated right ventricular systolic pressure is 49.0 mmHg.  Left Atrium: Left atrial size was mild to moderately dilated.  Right Atrium: Right atrial size was normal in size.  Pericardium: There is no evidence of pericardial effusion.  Mitral Valve: The mitral valve is degenerative in appearance. Moderate mitral annular calcification. Trivial mitral valve regurgitation. Mild mitral valve stenosis. The mean mitral valve gradient is 6.3 mmHg with average heart rate of 99 bpm.  Tricuspid Valve: The tricuspid valve is normal in structure. Tricuspid valve regurgitation is mild to moderate.  Aortic Valve: The aortic valve is tricuspid. There is mild calcification of the aortic valve. There is mild thickening of the aortic valve. Aortic valve regurgitation is not visualized. Aortic valve sclerosis/calcification is present, without any evidence of aortic stenosis.  Pulmonic Valve: The pulmonic valve was normal in structure. Pulmonic valve regurgitation is not visualized. No evidence of pulmonic stenosis.  Aorta: The aortic root and ascending aorta are structurally normal, with no evidence of dilitation.  Venous: The inferior vena cava is normal in size with greater than 50% respiratory variability, suggesting right atrial pressure of 3 mmHg.  IAS/Shunts: No atrial level shunt detected by color flow Doppler.   LEFT VENTRICLE PLAX 2D LVIDd:         4.30 cm LVIDs:         3.00 cm LV PW:         1.00 cm LV IVS:        1.00 cm LVOT diam:     2.00 cm LV SV:         39 LV SV Index:   22 LVOT Area:     3.14 cm   RIGHT VENTRICLE            IVC RV Basal diam:  3.80 cm    IVC diam: 1.80 cm RV S prime:     9.51 cm/s TAPSE (M-mode): 1.3 cm  LEFT ATRIUM             Index         RIGHT ATRIUM           Index LA diam:        4.70 cm 2.67 cm/m   RA Area:     13.70 cm LA Vol (A2C):   52.5 ml 29.77 ml/m  RA Volume:   32.50 ml  18.43 ml/m LA Vol (A4C):   56.9 ml 32.27 ml/m LA Biplane Vol: 58.9 ml 33.40 ml/m AORTIC VALVE LVOT Vmax:   71.07 cm/s LVOT Vmean:  48.233 cm/s LVOT VTI:    0.124 m  AORTA Ao Root diam: 3.10 cm Ao Asc diam:  3.70 cm  MITRAL VALVE  TRICUSPID VALVE MV Area (PHT): 2.91 cm     TR Peak grad:   46.0 mmHg MV Mean grad:  6.3 mmHg     TR Vmax:        339.00 cm/s MV Decel Time: 261 msec MV E velocity: 140.50 cm/s  SHUNTS Systemic VTI:  0.12 m Systemic Diam: 2.00 cm  Jerel Croitoru MD Electronically signed by Jerel Balding MD Signature Date/Time: 01/29/2024/3:55:02 PM    Final          ______________________________________________________________________________________________      Risk Assessment/Calculations:   {Does this patient have ATRIAL FIBRILLATION?:[phone]} No BP recorded.  {Refresh Note OR Click here to enter BP  :1}***        Physical Exam:     VS:  LMP  (LMP Unknown)  ***    Wt Readings from Last 3 Encounters:  04/04/24 162 lb 3.2 oz (73.6 kg)  03/13/24 163 lb (73.9 kg)  03/04/24 171 lb (77.6 kg)     GEN: Well nourished, well developed, in no acute distress NECK: No JVD; No carotid bruits CARDIAC: ***RRR, no murmurs, rubs, gallops RESPIRATORY:  Clear to auscultation without rales, wheezing or rhonchi  ABDOMEN: Soft, non-tender, non-distended, normal bowel sounds EXTREMITIES:  Warm and well perfused, no edema; No deformity, 2+ radial pulses PSYCH: Normal mood and affect   Assessment & Plan       {Are you ordering a CV Procedure (e.g. stress test, cath, DCCV, TEE, etc)?   Press F2        :[id number]}   This note was written with the assistance of a dictation microphone or AI dictation software. Please excuse any typos or grammatical errors.   Signed, Georganna Archer,  MD 06/09/2024 4:22 PM    [facility]

## 2024-06-10 ENCOUNTER — Ambulatory Visit
Attending: Student in an Organized Health Care Education/Training Program | Admitting: Student in an Organized Health Care Education/Training Program

## 2024-06-10 ENCOUNTER — Encounter: Payer: Self-pay | Admitting: Student in an Organized Health Care Education/Training Program

## 2024-06-10 VITALS — BP 142/80 | HR 88 | Ht 62.0 in | Wt 159.0 lb

## 2024-06-10 DIAGNOSIS — I4819 Other persistent atrial fibrillation: Secondary | ICD-10-CM

## 2024-06-10 DIAGNOSIS — R0683 Snoring: Secondary | ICD-10-CM

## 2024-06-10 DIAGNOSIS — E782 Mixed hyperlipidemia: Secondary | ICD-10-CM

## 2024-06-10 DIAGNOSIS — I10 Essential (primary) hypertension: Secondary | ICD-10-CM

## 2024-06-10 DIAGNOSIS — G4734 Idiopathic sleep related nonobstructive alveolar hypoventilation: Secondary | ICD-10-CM | POA: Diagnosis not present

## 2024-06-10 DIAGNOSIS — I5022 Chronic systolic (congestive) heart failure: Secondary | ICD-10-CM | POA: Diagnosis not present

## 2024-06-10 MED ORDER — FUROSEMIDE 20 MG PO TABS: 20.0000 mg | ORAL_TABLET | ORAL | 3 refills | Status: AC

## 2024-06-10 MED ORDER — LOSARTAN POTASSIUM 50 MG PO TABS: 50.0000 mg | ORAL_TABLET | Freq: Every day | ORAL | 3 refills | Status: AC

## 2024-06-10 NOTE — Assessment & Plan Note (Signed)
 Managed by EP.  Continue amiodarone  and Eliquis .

## 2024-06-10 NOTE — Assessment & Plan Note (Signed)
 Her blood pressures are too high at home in the office.  Her dose of losartan  was decreased in the setting of hypotension while hospitalized, but now her blood pressures are too high.  I will increase her losartan  to 50 mg daily. -Increase losartan  to 50 mg daily - Check BMP in 1 week

## 2024-06-10 NOTE — Patient Instructions (Signed)
 Medication Instructions:  INCREASE Losartan  to 50 mg daily CHANGE Lasix  to take every Monday, Wednesday, Friday  *If you need a refill on your cardiac medications before your next appointment, please call your pharmacy*  Lab Work: [facility] on 06/20/2024  If you have labs (blood work) drawn today and your tests are completely normal, you will receive your results only by: MyChart Message (if you have MyChart) OR A paper copy in the mail If you have any lab test that is abnormal or we need to change your treatment, we will call you to review the results.  [facility]  Your physician has recommended that you have a sleep study. This test records several body functions during sleep, including: brain activity, eye movement, oxygen and carbon dioxide blood levels, heart rate and rhythm, breathing rate and rhythm, the flow of air through your mouth and nose, snoring, body muscle movements, and chest and belly movement.   Follow-Up: At [facility], you and your health needs are our priority.  As part of our continuing mission to provide you with exceptional heart care, our providers are all part of one team.  This team includes your primary Cardiologist (physician) and Advanced Practice Providers or APPs (Physician Assistants and Nurse Practitioners) who all work together to provide you with the care you need, when you need it.  Your next appointment:   6 month(s)  Provider:   Georganna Archer, MD    We recommend signing up for the patient portal called MyChart.  Sign up information is provided on this After Visit Summary.  MyChart is used to connect with patients for Virtual Visits (Telemedicine).  Patients are able to view lab/test results, encounter notes, upcoming appointments, etc.  Non-urgent messages can be sent to your provider as well.   To learn more about what you can do with MyChart, go to [email or web address].

## 2024-06-10 NOTE — Assessment & Plan Note (Signed)
 She reportedly had a lipid panel done by her PCP recently.  I do not have access to these records.  I instructed her to get a copy of her lipid panel and send it to our office for me to review.

## 2024-06-17 ENCOUNTER — Encounter: Payer: Self-pay | Admitting: Student in an Organized Health Care Education/Training Program

## 2024-06-17 NOTE — Telephone Encounter (Signed)
 Error

## 2024-06-19 NOTE — Progress Notes (Unsigned)
  Electrophysiology Office Follow up Visit Note:    Date:  06/20/2024   ID:  Kristin Hancock, DOB Feb 06, 1931, MRN [id number]  PCP:  Vernon Velna SAUNDERS, MD  [facility] Cardiologist:  Georganna Archer, MD  [facility] Electrophysiologist:  OLE ONEIDA HOLTS, MD    Interval History:     Kristin Hancock is a 89 y.o. female who presents for a follow up visit.   The patient last saw her in May April 04, 2024.  She previously was on flecainide .  She is currently taking amiodarone  for rhythm control.  She takes Eliquis  for stroke prophylaxis.  She is feeling well today.  She is with her son.  She does report some right knee pain for which she is receiving some gel injections by the orthopedics team.       Past medical, surgical, social and family history were reviewed.  ROS:   Please see the history of present illness.    All other systems reviewed and are negative.  EKGs/Labs/Other Studies Reviewed:    The following studies were reviewed today:     EKG Interpretation Date/Time:  Friday June 20 2024 11:42:19 EDT Ventricular Rate:  78 PR Interval:  148 QRS Duration:  86 QT Interval:  428 QTC Calculation: 487 R Axis:   67  Text Interpretation: Normal sinus rhythm Confirmed by HOLTS OLE [phone]) on 06/20/2024 11:50:39 AM    Physical Exam:    VS:  BP 124/60 (BP Location: Left Arm, Patient Position: Sitting, Cuff Size: Normal)   Pulse 78   Ht 5' 2 (1.575 m)   Wt 159 lb (72.1 kg)   LMP  (LMP Unknown)   SpO2 97%   BMI 29.08 kg/m     Wt Readings from Last 3 Encounters:  06/20/24 159 lb (72.1 kg)  06/10/24 159 lb (72.1 kg)  04/04/24 162 lb 3.2 oz (73.6 kg)     GEN: no distress CARD: RRR, No MRG RESP: No IWOB. CTAB.      ASSESSMENT:    1. Persistent atrial fibrillation (HCC)   2. Encounter for long-term (current) use of high-risk medication    PLAN:    In order of problems listed above:  #Persistent atrial  fibrillation #High risk med monitoring-amiodarone  Doing well on amiodarone .  Maintaining normal rhythm.  Follow-up 6 months with APP.  Will need amiodarone  monitoring labs at that appointment.    Signed, OLE HOLTS, MD, [facility], [facility] 06/20/2024 11:54 AM    Electrophysiology [facility]

## 2024-06-20 ENCOUNTER — Ambulatory Visit: Attending: Cardiology | Admitting: Cardiology

## 2024-06-20 ENCOUNTER — Encounter: Payer: Self-pay | Admitting: Cardiology

## 2024-06-20 ENCOUNTER — Ambulatory Visit: Admitting: Student in an Organized Health Care Education/Training Program

## 2024-06-20 VITALS — BP 124/60 | HR 78 | Ht 62.0 in | Wt 159.0 lb

## 2024-06-20 DIAGNOSIS — I4819 Other persistent atrial fibrillation: Secondary | ICD-10-CM | POA: Diagnosis not present

## 2024-06-20 DIAGNOSIS — Z79899 Other long term (current) drug therapy: Secondary | ICD-10-CM

## 2024-06-20 LAB — BASIC METABOLIC PANEL WITH GFR
BUN/Creatinine Ratio: 22 (ref 12–28)
BUN: 20 mg/dL (ref 10–36)
CO2: 24 mmol/L (ref 20–29)
Calcium: 9.6 mg/dL (ref 8.7–10.3)
Chloride: 101 mmol/L (ref 96–106)
Creatinine, Ser: 0.9 mg/dL (ref 0.57–1.00)
Glucose: 95 mg/dL (ref 70–99)
Potassium: 4.6 mmol/L (ref 3.5–5.2)
Sodium: 139 mmol/L (ref 134–144)
eGFR: 60 mL/min/1.73 (ref >59)

## 2024-06-20 NOTE — Patient Instructions (Signed)

## 2024-06-22 ENCOUNTER — Ambulatory Visit: Payer: Self-pay | Admitting: Student in an Organized Health Care Education/Training Program

## 2024-06-23 NOTE — Progress Notes (Signed)
Normal lab mailed.

## 2024-07-01 DIAGNOSIS — M1711 Unilateral primary osteoarthritis, right knee: Secondary | ICD-10-CM | POA: Diagnosis not present

## 2024-07-09 DIAGNOSIS — M1711 Unilateral primary osteoarthritis, right knee: Secondary | ICD-10-CM | POA: Diagnosis not present

## 2024-07-15 DIAGNOSIS — H43813 Vitreous degeneration, bilateral: Secondary | ICD-10-CM | POA: Diagnosis not present

## 2024-07-15 DIAGNOSIS — H35373 Puckering of macula, bilateral: Secondary | ICD-10-CM | POA: Diagnosis not present

## 2024-07-15 DIAGNOSIS — H353133 Nonexudative age-related macular degeneration, bilateral, advanced atrophic without subfoveal involvement: Secondary | ICD-10-CM | POA: Diagnosis not present

## 2024-08-30 ENCOUNTER — Encounter: Payer: Self-pay | Admitting: Cardiology

## 2024-08-30 DIAGNOSIS — R0683 Snoring: Secondary | ICD-10-CM

## 2024-09-01 NOTE — Procedures (Signed)
   SLEEP STUDY REPORT Patient Information Study Date: 08/30/2024 Patient Name: Kristin Hancock Patient ID: [id number] Birth Date: 17-Mar-1931 Age: 89 Gender: Female BMI: 29.2 (W=159 lb, H=5' 2'') Referring Physician: Georganna Floretta COME  TEST DESCRIPTION: Home sleep apnea testing was completed using the WatchPat, a Type 1 device, utilizing peripheral arterial tonometry (PAT), chest movement, actigraphy, pulse oximetry, pulse rate, body position and snore. AHI was calculated with apnea and hypopnea using valid sleep time as the denominator. RDI includes apneas, hypopneas, and RERAs. The data acquired and the scoring of sleep and all associated events were performed in accordance with the recommended standards and specifications as outlined in the AASM Manual for the Scoring of Sleep and Associated Events 2.2.0 (2015).  FINDINGS: 1. No evidence of Obstructive Sleep Apnea with AHI 0.2/hr. 2. No Central Sleep Apnea. 3. Oxygen desaturations as low as 92%. 4. Moderate snoring was present. O2 sats were < 88% for 0 minutes. 5. Total sleep time was 5 hrs and 55 min. 6. 19.9% of total sleep time was spent in REM sleep. 7. Shortened sleep onset latency at 6 min. 8. Prolonged REM sleep onset latency at 103 min. 9. Total awakenings were 5.  DIAGNOSIS: Normal study with no significant sleep disordered breathing.  RECOMMENDATIONS: 1. Normal study with no significant sleep disordered breathing. 2. Healthy sleep recommendations include: adequate nightly sleep (normal 7-9 hrs/night), avoidance of caffeine after noon and alcohol near bedtime, and maintaining a sleep environment that is cool, dark and quiet. 3. Weight loss for overweight patients is recommended. 4. Snoring recommendations include: weight loss where appropriate, side sleeping, and avoidance of alcohol before bed. 5. Operation of motor vehicle or dangerous equipment must be avoided when feeling drowsy, excessively sleepy,  or mentally fatigued. 6. An ENT consultation which may be useful for specific causes of and possible treatment of bothersome snoring . 7. Weight loss may be of benefit in reducing the severity of snoring.   Manual scoring / review was performed by Gennett Rush Signature: Wilbert Bihari, MD; [facility]; Diplomat, American Board of Sleep Medicine Electronically Signed: 09/01/2024 6:52:30 PM

## 2024-09-02 ENCOUNTER — Telehealth: Payer: Self-pay | Admitting: *Deleted

## 2024-09-02 NOTE — Telephone Encounter (Signed)
-----   Message from Kristin Hancock sent at 09/01/2024  6:54 PM EST ----- Please let patient know that sleep study showed no significant sleep apnea.

## 2024-09-02 NOTE — Telephone Encounter (Signed)
 The patient has been notified of the result and verbalized understanding.  All questions (if any) were answered. Joshua Dalton Seip, CMA 09/02/2024 12:54 PM   Pt is agreeable to normal results.

## 2024-09-21 ENCOUNTER — Other Ambulatory Visit: Payer: Self-pay | Admitting: Cardiology

## 2024-09-21 DIAGNOSIS — I4819 Other persistent atrial fibrillation: Secondary | ICD-10-CM

## 2024-10-01 ENCOUNTER — Telehealth: Payer: Self-pay | Admitting: Cardiology

## 2024-10-01 NOTE — Telephone Encounter (Signed)
" °*  STAT* If patient is at the pharmacy, call can be transferred to refill team.   1. Which medications need to be refilled? (please list name of each medication and dose if known)   amiodarone  (PACERONE ) 200 MG tablet    2. Which pharmacy/location (including street and city if local pharmacy) is medication to be sent to? [id number] - [place], [place] - [address]   3. Do they need a 30 day or 90 day supply? 90   Patient is out of medication  "

## 2024-10-02 MED ORDER — AMIODARONE HCL 200 MG PO TABS: 200.0000 mg | ORAL_TABLET | Freq: Every day | ORAL | 2 refills | Status: AC

## 2024-10-02 NOTE — Telephone Encounter (Signed)
 RX sent in.

## 2024-10-24 ENCOUNTER — Telehealth: Payer: Self-pay | Admitting: Student in an Organized Health Care Education/Training Program

## 2024-10-24 DIAGNOSIS — I4819 Other persistent atrial fibrillation: Secondary | ICD-10-CM

## 2024-10-24 NOTE — Telephone Encounter (Signed)
" °*  STAT* If patient is at the pharmacy, call can be transferred to refill team.   1. Which medications need to be refilled? (please list name of each medication and dose if known) ELIQUIS  5 MG TABS tablet    2. Would you like to learn more about the convenience, safety, & potential cost savings by using the [facility]? No    3. Are you open to using the [facility] ([facility]. No    4. Which pharmacy/location (including street and city if local pharmacy) is medication to be sent to? [id number] - [place], [place] - [address]     5. Do they need a 30 day or 90 day supply? 90 day   "

## 2024-10-27 MED ORDER — APIXABAN 5 MG PO TABS: 5.0000 mg | ORAL_TABLET | Freq: Two times a day (BID) | ORAL | 1 refills | Status: AC

## 2024-12-09 ENCOUNTER — Ambulatory Visit: Admitting: Student in an Organized Health Care Education/Training Program

## 2024-12-19 ENCOUNTER — Ambulatory Visit: Admitting: Physician Assistant
